# Patient Record
Sex: Female | Born: 1940 | Race: White | Hispanic: No | Marital: Married | Smoking: Never smoker
Health system: Southern US, Community
[De-identification: ages and names within clinical notes are randomized; demographics above are authoritative.]

## PROBLEM LIST (undated history)

## (undated) DIAGNOSIS — C801 Malignant (primary) neoplasm, unspecified: Secondary | ICD-10-CM

## (undated) DIAGNOSIS — K219 Gastro-esophageal reflux disease without esophagitis: Secondary | ICD-10-CM

## (undated) DIAGNOSIS — R002 Palpitations: Secondary | ICD-10-CM

## (undated) DIAGNOSIS — E039 Hypothyroidism, unspecified: Secondary | ICD-10-CM

## (undated) HISTORY — PX: COLONOSCOPY: SHX174

## (undated) HISTORY — PX: MOHS SURGERY: SUR867

---

## 2022-01-23 ENCOUNTER — Telehealth: Payer: Self-pay | Admitting: Family Medicine

## 2022-01-23 ENCOUNTER — Ambulatory Visit: Payer: Medicare HMO | Admitting: Podiatry

## 2022-01-23 ENCOUNTER — Emergency Department
Admission: EM | Admit: 2022-01-23 | Discharge: 2022-01-23 | Disposition: A | Discharge status: Home / Self Care | Payer: Medicare HMO | Source: Home / Self Care

## 2022-01-23 ENCOUNTER — Emergency Department (INDEPENDENT_AMBULATORY_CARE_PROVIDER_SITE_OTHER): Payer: Medicare HMO

## 2022-01-23 DIAGNOSIS — S82891A Other fracture of right lower leg, initial encounter for closed fracture: Secondary | ICD-10-CM

## 2022-01-23 DIAGNOSIS — S93431A Sprain of tibiofibular ligament of right ankle, initial encounter: Secondary | ICD-10-CM | POA: Diagnosis not present

## 2022-01-23 DIAGNOSIS — M25571 Pain in right ankle and joints of right foot: Secondary | ICD-10-CM

## 2022-01-23 DIAGNOSIS — S82401B Unspecified fracture of shaft of right fibula, initial encounter for open fracture type I or II: Secondary | ICD-10-CM

## 2022-01-23 DIAGNOSIS — S82111B Displaced fracture of right tibial spine, initial encounter for open fracture type I or II: Secondary | ICD-10-CM | POA: Diagnosis not present

## 2022-01-23 MED ORDER — HYDROCODONE-ACETAMINOPHEN 5-325 MG PO TABS
1.0000 | ORAL_TABLET | Freq: Every day | ORAL | 0 refills | Status: DC | PRN
Start: 2022-01-23 — End: 2023-07-28

## 2022-01-23 MED ORDER — HYDROCODONE-ACETAMINOPHEN 5-325 MG PO TABS: 1.0000 | ORAL_TABLET | Freq: Every day | ORAL | 0 refills | Status: DC | PRN

## 2022-01-23 MED ORDER — ACETAMINOPHEN 325 MG PO TABS
325.0000 mg | ORAL_TABLET | Freq: Once | ORAL | Status: AC
  Administered 2022-01-23: 325 mg via ORAL

## 2022-01-23 NOTE — ED Provider Notes (Signed)
[name]    CSN: [id number] Arrival date & time: 01/23/22  1052      History   Chief Complaint Chief Complaint  Patient presents with   Ankle Pain    RT    HPI Gina Cline is a 81 y.o. female.   HPI Very pleasant 81 year old female presents with right ankle pain since earlier this morning when she slipped in her garden.  Patient reports right ankle pain as 5 of 10 currently and has used ice and aspirin as needed.  Patient is accompanied by her husband and daughter this morning.  PMH significant for GERD and osteoporosis.  History reviewed. No pertinent past medical history.  There are no problems to display for this patient.   History reviewed. No pertinent surgical history.  OB History   No obstetric history on file.      Home Medications    Prior to Admission medications   Medication Sig Start Date End Date Taking? Authorizing Provider  HYDROcodone-acetaminophen (NORCO/VICODIN) 5-325 MG tablet Take 1 tablet by mouth daily as needed for moderate pain. 01/23/22  Yes Eliezer Lofts, FNP  omeprazole (PRILOSEC) 40 MG capsule Take by mouth.    [provider]    Family History History reviewed. No pertinent family history.  Social History Social History   Tobacco Use   Smoking status: Never   Smokeless tobacco: Never  Substance Use Topics   Alcohol use: Not Currently     Allergies   Penicillins and Erythromycin   Review of Systems Review of Systems  Musculoskeletal:        Right ankle pain x1 day  All other systems reviewed and are negative.    Physical Exam Triage Vital Signs ED Triage Vitals  Enc Vitals Group     BP 01/23/22 1106 (!) 166/84     Pulse Rate 01/23/22 1106 72     Resp 01/23/22 1106 17     Temp 01/23/22 1106 98.9 F (37.2 C)     Temp Source 01/23/22 1106 Oral     SpO2 01/23/22 1106 96 %     Weight --      Height --      Head Circumference --      Peak Flow --      Pain Score 01/23/22 1107 5      Pain Loc --      Pain Edu? --      Excl. in [place]? --    No data found.  Updated Vital Signs BP (!) 166/84 (BP Location: Left Arm)   Pulse 72   Temp 98.9 F (37.2 C) (Oral)   Resp 17   SpO2 96%      Physical Exam Vitals and nursing note reviewed.  Constitutional:      General: She is not in acute distress.    Appearance: Normal appearance. She is normal weight. She is not ill-appearing.  HENT:     Head: Normocephalic and atraumatic.     Mouth/Throat:     Mouth: Mucous membranes are moist.     Pharynx: Oropharynx is clear.  Eyes:     Extraocular Movements: Extraocular movements intact.     Conjunctiva/sclera: Conjunctivae normal.     Pupils: Pupils are equal, round, and reactive to light.  Cardiovascular:     Rate and Rhythm: Normal rate and regular rhythm.     Pulses: Normal pulses.     Heart sounds: Normal heart sounds.  Pulmonary:     Effort: Pulmonary  effort is normal.     Breath sounds: Normal breath sounds. No wheezing, rhonchi or rales.  Musculoskeletal:     Cervical back: Normal range of motion and neck supple.     Comments: Right ankle: TTP over lateral/medial malleolus with moderate soft tissue swelling noted, exam limited due to pain this morning  Skin:    General: Skin is warm and dry.  Neurological:     General: No focal deficit present.     Mental Status: She is alert and oriented to person, place, and time. Mental status is at baseline.     MDM: 1. UC Treatments / Results  Labs (all labs ordered are listed, but only abnormal results are displayed) Labs Reviewed - No data to display  EKG   Radiology DG Ankle Complete Right  Result Date: 01/23/2022 CLINICAL DATA:  Right ankle pain after slipped and garden this morning. Injury. Lateral pain and swelling. EXAM: RIGHT ANKLE - COMPLETE 3+ VIEW COMPARISON:  None Available. FINDINGS: Moderate lateral and mild medial malleolar soft tissue swelling. There is an oblique fracture of the distal fibular  metadiaphysis with up to 3 mm lateral cortical displacement/step-off of the distal component with respect to the proximal component. There is up to 3 mm posterior displacement of the distal fracture component with respect to the proximal fracture component on lateral view. Additional oblique fracture of the superior medial malleolus without displacement. Vertically oriented fracture of the posterior malleolus with approximately 2 mm superior displacement of the posterior component with respect to the anterior component. The ankle mortise remains symmetric and intact. Minimal plantar and posterior calcaneal heel spurs.  No dislocation. IMPRESSION: Mildly displaced distal fibular acute oblique fracture, mildly displaced acute posterior malleolar fracture, and nondisplaced superior medial malleolar acute fracture. Electronically Signed   By: Yvonne Kendall M.D.   On: 01/23/2022 11:29    Procedures Procedures (including critical care time)  Medications Ordered in UC Medications - No data to display  Initial Impression / Assessment and Plan / UC Course  I have reviewed the triage vital signs and the nursing notes.  Pertinent labs & imaging results that were available during my care of the patient were reviewed by me and considered in my medical decision making (see chart for details).    MDM: 1.  Type I/II open fracture of shaft of right fibula; 2.  Type I/II displaced fracture of right tibia, initial encounter, right ankle x-ray revealed above, Rx'd Vicodin, patient placed in cam walker boot and crutches prior to discharge.  Referred to [facility] for later today for further evaluation. Advised/informed patient of right ankle fractures and need for immediate orthopedic follow-up.  Advised patient to wear cam walker boot/ambulate with crutches or wheelchair 24/7 until being evaluated by orthopedic provider.  Encouraged patient to avoid any weightbearing on right extremity.  Advised patient may RICE  affected area of right ankle for 25 minutes 3 times daily for the next 3 days.  Advised may use Vicodin daily or as needed for pain.  Encouraged patient to increase daily water intake while taking this medication.  RN staff has called and made appointment for patient at [facility] for later on this afternoon for orthopedics/fracture management. Patient discharged home, hemodynamically stable. Final Clinical Impressions(s) / UC Diagnoses   Final diagnoses:  Acute right ankle pain  Type I or II open fracture of shaft of right fibula, unspecified fracture morphology, initial encounter  Type I or II open displaced  fracture of spine of right tibia, initial encounter     Discharge Instructions      Advised/informed patient of right ankle fractures and need for immediate orthopedic follow-up.  Advised patient to wear cam walker boot/ambulate with crutches or wheelchair 24/7 until being evaluated by orthopedic provider.  Encouraged patient to avoid any weightbearing on right extremity.  Advised patient may RICE affected area of right ankle for 25 minutes 3 times daily for the next 3 days.  Advised may use Vicodin daily or as needed for pain.  Encouraged patient to increase daily water intake while taking this medication.  RN staff has called and made appointment for patient at [facility] for later on this afternoon for orthopedics/fracture management.     ED Prescriptions     Medication Sig Dispense Auth. Provider   HYDROcodone-acetaminophen (NORCO/VICODIN) 5-325 MG tablet Take 1 tablet by mouth daily as needed for moderate pain. 21 tablet Eliezer Lofts, FNP      I have reviewed the PDMP during this encounter.   Eliezer Lofts, [place] 01/23/22 1209

## 2022-01-23 NOTE — Discharge Instructions (Addendum)
Advised/informed patient of right ankle fractures and need for immediate orthopedic follow-up.  Advised patient to wear cam walker boot/ambulate with crutches or wheelchair 24/7 until being evaluated by orthopedic provider.  Encouraged patient to avoid any weightbearing on right extremity.  Advised patient may RICE affected area of right ankle for 25 minutes 3 times daily for the next 3 days.  Advised may use Vicodin daily or as needed for pain.  Encouraged patient to increase daily water intake while taking this medication.  RN staff has called and made appointment for patient at [facility] for later on this afternoon for orthopedics/fracture management.

## 2022-01-23 NOTE — Telephone Encounter (Signed)
Patient was unable to have prescription filled at originally requested pharmacy.  Reports medication was not available at this pharmacy and requested new pharmacy.  Vicodin has been sent to [facility] in [facility] [place], [place] as requested.

## 2022-01-23 NOTE — ED Triage Notes (Addendum)
Pt c/o RT ankle pain since this morning when she slipped in her garden. Ice and aspirin prn. Pain 5/10

## 2022-01-24 ENCOUNTER — Telehealth: Payer: Self-pay | Admitting: Urology

## 2022-01-28 ENCOUNTER — Telehealth: Payer: Self-pay | Admitting: Family Medicine

## 2022-01-28 ENCOUNTER — Telehealth: Payer: Self-pay

## 2022-01-28 MED ORDER — HYDROCODONE-ACETAMINOPHEN 5-325 MG PO TABS: 2.0000 | ORAL_TABLET | Freq: Two times a day (BID) | ORAL | 0 refills | Status: DC | PRN

## 2022-01-28 NOTE — Telephone Encounter (Signed)
Patient/patient's husband calling to refill pain medication-Vicodin.  Patient evaluated by me on 621/23 for displaced fracture of distal right tibia/fibula and is scheduled for right ankle ORIF on Friday, 02/01/2022.  Advised husband hardcopy prescription will be available at front desk for him to pick up.

## 2022-01-30 ENCOUNTER — Encounter
Admission: RE | Admit: 2022-01-30 | Discharge: 2022-01-30 | Disposition: A | Discharge status: Home / Self Care | Payer: Medicare HMO | Source: Ambulatory Visit | Attending: Podiatry | Admitting: Podiatry

## 2022-01-30 HISTORY — DX: Malignant (primary) neoplasm, unspecified: C80.1

## 2022-01-30 HISTORY — DX: Hypothyroidism, unspecified: E03.9

## 2022-01-30 HISTORY — DX: Gastro-esophageal reflux disease without esophagitis: K21.9

## 2022-01-30 HISTORY — DX: Palpitations: R00.2

## 2022-01-30 NOTE — Progress Notes (Signed)
Perioperative Services Pre-Admission/Anesthesia Testing   Date: 01/30/22 Name: Gina Cline MRN:   [id number]  Re: Consideration of preoperative prophylactic antibiotic change   Request sent to: Felipa Furnace, DPM (routed and/or faxed via [facility])  Planned Surgical Procedure(s):    Case: [id number] Date/Time: 02/01/22 1230   Procedures:      OPEN REDUCTION INTERNAL FIXATION (ORIF) RIGHT ANKLE FRACTURE (Right: Ankle) - CHOICE WITH BLOCK     RIGHT SYNDESMOSIS REPAIR (Right)   Anesthesia type: Choice   Pre-op diagnosis: RIGHT ANKLE FRACTURE   Location: [facility] OR ROOM 09 / [facility] AN[facility]   Surgeons: Felipa Furnace, DPM   Clinical Notes:  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 No severe rash involving mucous membranes or skin necrosis. No hospital admissions related to side effects of PCN/cephalosporin use.  No documented reaction to PCN or cephalosporin in the last 10 years.  Request:  As an evidence based approach to reducing the rate of incidence for post-operative SSI and the development of MDROs, could an agent that allows for narrower antimicrobial coverage for preoperative prophylaxis in this patient's upcoming surgical course be considered?   Currently ordered preoperative prophylactic ABX: vancomycin.   Specifically requesting change to cephalosporin (CEFAZOLIN).  Drug of choice for many procedures; it is the most widely studied antimicrobial agent with proven efficacy for antimicrobial prophylaxis.   Desirable duration of action, spectrum of activity against organisms commonly encountered in surgery, and it has an excellent safety profile and low cost.   Active against streptococci, methicillin-susceptible staphylococci, and many gram-negative organisms.  Please  communicate decision with me and I will change the orders in Epic as per your direction.   Things to consider: Many patients report that they were "allergic" to PCN earlier in life, however this does not translate into a true lifelong allergy. Patients can lose sensitivity to specific IgE antibodies over time if PCN is avoided (Kleris & Lugar, 2019).  Up to 10% of the adult population and 15% of hospitalized patients report an allergy to PCN, however clinical studies suggest that 90% of those reporting an allergy can tolerate PCN antibiotics (Kleris & Lugar, 2019).  Cross-sensitivity between PCN and cephalosporins has been documented as being as high as 10%, however this estimation included data believed to have been collected in a setting where there was contamination. Newer data suggests that the prevalence of cross-sensitivity between PCN and cephalosporins is actually estimated to be closer to 1% (Hermanides et al., 2018).   Patients labeled as PCN allergic, whether they are truly allergic or not, have been found to have inferior outcomes in terms of rates of serious infection, and these patients tend to have longer hospital stays ([place], 2019).  Treatment related secondary infections, such as Clostridioides difficile, have been linked to the improper use of broad spectrum antibiotics in patients improperly labeled as PCN allergic (Kleris & Lugar, 2019).  Anaphylaxis from cephalosporins is rare and the evidence suggests that there is no increased risk of an anaphylactic type reaction when cephalosporins are used in a PCN allergic patient (Pichichero, 2006).  Citations: Hermanides J, Lemkes BA, Prins Pearla Dubonnet MW, Terreehorst I. Presumed ?-Lactam Allergy and Cross-reactivity in the Operating Theater: A Practical Approach. Anesthesiology. 2018 Aug;129(2):335-342. doi: 10.1097/ALN.0000000000002252. PMID: 29937169.  Kleris, Dunbar., & Lugar, P. L. (2019). Things We Do For  No Reason: Failing to  Question a Penicillin Allergy History. Journal of hospital medicine, 14(10), [phone]. Advance online publication. https://www.wallace-m[place]leton.info/  Pichichero, M. E. (2006). Cephalosporins can be prescribed safely for penicillin-allergic patients. Journal of family medicine, 55(2), 106-112. Accessed: https://cdn.mdedge.com/files/s85f-public/Document/September-2017/5502JFP_AppliedEvidence1.pdf   BHonor Loh MSN, APRN, FNP-C, CEN [facility] Peri-operative Services Nurse Practitioner FAX: [phone]/28/23 5:20 PM

## 2022-01-30 NOTE — Patient Instructions (Signed)
Your procedure is scheduled on:02-01-22 Friday Report to the Registration Desk on the 1st floor of the [facility].Then proceed to the 2nd floor Surgery Desk To find out your arrival time, please call [phone] between 1PM - 3PM on:01-31-22 Thursday If your arrival time is 6:00 am, do not arrive prior to that time as the [facility] entrance doors do not open until 6:00 am.  REMEMBER: Instructions that are not followed completely may result in serious medical risk, up to and including death; or upon the discretion of your surgeon and anesthesiologist your surgery may need to be rescheduled.  Do not eat food after midnight the night before surgery.  No gum chewing, lozengers or hard candies.  You may however, drink CLEAR liquids up to 2 hours before you are scheduled to arrive for your surgery. Do not drink anything within 2 hours of your scheduled arrival time.  Clear liquids include: - water  - apple juice without pulp - gatorade (not RED colors) - black coffee or tea (Do NOT add milk or creamers to the coffee or tea) Do NOT drink anything that is not on this list.  TAKE THESE MEDICATIONS THE MORNING OF SURGERY WITH A SIP OF WATER: -omeprazole (PRILOSEC) -take one the night before and one on the morning of surgery - helps to prevent nausea after surgery.)  One week prior to surgery: Stop Anti-inflammatories (NSAIDS) such as Advil, Aleve, Ibuprofen, Motrin, Naproxen, Naprosyn and Aspirin based products such as Excedrin, Goodys Powder, BC Powder.You may however, continue to take Tylenol/Hydrocodone if needed for pain up until the day of surgery.  Stop ANY OVER THE COUNTER supplements/vitamins NOW (01-30-22) until after surgery.  No Alcohol for 24 hours before or after surgery.  No Smoking including e-cigarettes for 24 hours prior to surgery.  No chewable tobacco products for at least 6 hours prior to surgery.  No nicotine patches on the day of surgery.  Do not use any  "recreational" drugs for at least a week prior to your surgery.  Please be advised that the combination of cocaine and anesthesia may have negative outcomes, up to and including death. If you test positive for cocaine, your surgery will be cancelled.  On the morning of surgery brush your teeth with toothpaste and water, you may rinse your mouth with mouthwash if you wish. Do not swallow any toothpaste or mouthwash.  Do not wear jewelry, make-up, hairpins, clips or nail polish.  Do not wear lotions, powders, or perfumes.   Do not shave body from the neck down 48 hours prior to surgery just in case you cut yourself which could leave a site for infection.  Also, freshly shaved skin may become irritated if using the CHG soap.  Contact lenses, hearing aids and dentures may not be worn into surgery.  Do not bring valuables to the hospital. [facility] is not responsible for any missing/lost belongings or valuables.   Notify your doctor if there is any change in your medical condition (cold, fever, infection).  Wear comfortable clothing (specific to your surgery type) to the hospital.  After surgery, you can help prevent lung complications by doing breathing exercises.  Take deep breaths and cough every 1-2 hours. Your doctor may order a device called an Incentive Spirometer to help you take deep breaths. When coughing or sneezing, hold a pillow firmly against your incision with both hands. This is called "splinting." Doing this helps protect your incision. It also decreases belly discomfort.  If you are  being admitted to the hospital overnight, leave your suitcase in the car. After surgery it may be brought to your room.  If you are being discharged the day of surgery, you will not be allowed to drive home. You will need a responsible adult (18 years or older) to drive you home and stay with you that night.   If you are taking public transportation, you will need to have a responsible adult  (18 years or older) with you. Please confirm with your physician that it is acceptable to use public transportation.   Please call the [facility]. at [phone] if you have any questions about these instructions.  Surgery Visitation Policy:  Patients undergoing a surgery or procedure may have two family members or support persons with them as long as the person is not COVID-19 positive or experiencing its symptoms.

## 2022-01-30 NOTE — H&P (View-Only) (Signed)
Perioperative Services Pre-Admission/Anesthesia Testing   Date: 01/30/22 Name: Gina Cline MRN:   [id number]  Re: Consideration of preoperative prophylactic antibiotic change   Request sent to: Patel, Kevin P, DPM (routed and/or faxed via [facility])  Planned Surgical Procedure(s):    Case: [id number] Date/Time: 02/01/22 1230   Procedures:      OPEN REDUCTION INTERNAL FIXATION (ORIF) RIGHT ANKLE FRACTURE (Right: Ankle) - CHOICE WITH BLOCK     RIGHT SYNDESMOSIS REPAIR (Right)   Anesthesia type: Choice   Pre-op diagnosis: RIGHT ANKLE FRACTURE   Location: [facility] OR ROOM 09 / [facility] FOR ANEST[facility]   Surgeons: Patel, Kevin P, DPM   Clinical Notes:  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 No severe rash involving mucous membranes or skin necrosis. No hospital admissions related to side effects of PCN/cephalosporin use.  No documented reaction to PCN or cephalosporin in the last 10 years.  Request:  As an evidence based approach to reducing the rate of incidence for post-operative SSI and the development of MDROs, could an agent that allows for narrower antimicrobial coverage for preoperative prophylaxis in this patient's upcoming surgical course be considered?   Currently ordered preoperative prophylactic ABX: vancomycin.   Specifically requesting change to cephalosporin (CEFAZOLIN).  Drug of choice for many procedures; it is the most widely studied antimicrobial agent with proven efficacy for antimicrobial prophylaxis.   Desirable duration of action, spectrum of activity against organisms commonly encountered in surgery, and it has an excellent safety profile and low cost.   Active against streptococci, methicillin-susceptible staphylococci, and many gram-negative organisms.  Please  communicate decision with me and I will change the orders in Epic as per your direction.   Things to consider: Many patients report that they were "allergic" to PCN earlier in life, however this does not translate into a true lifelong allergy. Patients can lose sensitivity to specific IgE antibodies over time if PCN is avoided (Kleris & Lugar, 2019).  Up to 10% of the adult population and 15% of hospitalized patients report an allergy to PCN, however clinical studies suggest that 90% of those reporting an allergy can tolerate PCN antibiotics (Kleris & Lugar, 2019).  Cross-sensitivity between PCN and cephalosporins has been documented as being as high as 10%, however this estimation included data believed to have been collected in a setting where there was contamination. Newer data suggests that the prevalence of cross-sensitivity between PCN and cephalosporins is actually estimated to be closer to 1% (Hermanides et al., 2018).   Patients labeled as PCN allergic, whether they are truly allergic or not, have been found to have inferior outcomes in terms of rates of serious infection, and these patients tend to have longer hospital stays (Kleris & Lugar, 2019).  Treatment related secondary infections, such as Clostridioides difficile, have been linked to the improper use of broad spectrum antibiotics in patients improperly labeled as PCN allergic (Kleris & Lugar, 2019).  Anaphylaxis from cephalosporins is rare and the evidence suggests that there is no increased risk of an anaphylactic type reaction when cephalosporins are used in a PCN allergic patient (Pichichero, 2006).  Citations: Hermanides J, Lemkes BA, Prins JM, Hollmann MW, Terreehorst I. Presumed ?-Lactam Allergy and Cross-reactivity in the Operating Theater: A Practical Approach. Anesthesiology. 2018 Aug;129(2):335-342. doi: 10.1097/ALN.0000000000002252. PMID: 29762180.  Kleris, R. S., & Lugar, P. L. (2019). Things We Do For   No Reason: Failing to  Question a Penicillin Allergy History. Journal of hospital medicine, 14(10), 704-706. Advance online publication. https://doi.org/10.12788/jhm.3170  Pichichero, M. E. (2006). Cephalosporins can be prescribed safely for penicillin-allergic patients. Journal of family medicine, 55(2), 106-112. Accessed: https://cdn.mdedge.com/files/s3fs-public/Document/September-2017/5502JFP_AppliedEvidence1.pdf   Malosi Hemstreet, MSN, APRN, FNP-C, CEN [facility]  Peri-operative Services Nurse Practitioner FAX: [phone] 01/30/22 5:20 PM  

## 2022-02-01 ENCOUNTER — Other Ambulatory Visit: Payer: Self-pay

## 2022-02-01 ENCOUNTER — Encounter: Payer: Self-pay | Admitting: Podiatry

## 2022-02-01 ENCOUNTER — Ambulatory Visit: Payer: Medicare HMO | Admitting: Registered Nurse

## 2022-02-01 ENCOUNTER — Ambulatory Visit: Payer: Medicare HMO

## 2022-02-01 ENCOUNTER — Encounter
Admission: RE | Disposition: A | Discharge status: Home / Self Care | Payer: Self-pay | Source: Home / Self Care | Attending: Podiatry

## 2022-02-01 ENCOUNTER — Ambulatory Visit
Admission: RE | Admit: 2022-02-01 | Discharge: 2022-02-01 | Disposition: A | Discharge status: Home / Self Care | Payer: Medicare HMO | Attending: Podiatry | Admitting: Podiatry

## 2022-02-01 ENCOUNTER — Other Ambulatory Visit: Payer: Self-pay | Admitting: Podiatry

## 2022-02-01 DIAGNOSIS — X58XXXA Exposure to other specified factors, initial encounter: Secondary | ICD-10-CM | POA: Diagnosis not present

## 2022-02-01 DIAGNOSIS — E039 Hypothyroidism, unspecified: Secondary | ICD-10-CM | POA: Diagnosis not present

## 2022-02-01 DIAGNOSIS — K219 Gastro-esophageal reflux disease without esophagitis: Secondary | ICD-10-CM | POA: Diagnosis not present

## 2022-02-01 DIAGNOSIS — S82851A Displaced trimalleolar fracture of right lower leg, initial encounter for closed fracture: Secondary | ICD-10-CM | POA: Insufficient documentation

## 2022-02-01 DIAGNOSIS — S82891A Other fracture of right lower leg, initial encounter for closed fracture: Secondary | ICD-10-CM

## 2022-02-01 HISTORY — PX: ORIF ANKLE FRACTURE: SHX5408

## 2022-02-01 SURGERY — OPEN REDUCTION INTERNAL FIXATION (ORIF) ANKLE FRACTURE
Anesthesia: General | Site: Ankle | Laterality: Right

## 2022-02-01 MED ORDER — DEXMEDETOMIDINE (PRECEDEX) IN NS 20 MCG/5ML (4 MCG/ML) IV SYRINGE
PREFILLED_SYRINGE | INTRAVENOUS | Status: DC | PRN
  Administered 2022-02-01: 8 ug via INTRAVENOUS

## 2022-02-01 MED ORDER — OXYCODONE HCL 5 MG PO TABS
ORAL_TABLET | ORAL | Status: AC
  Filled 2022-02-01: qty 1

## 2022-02-01 MED ORDER — FENTANYL CITRATE (PF) 100 MCG/2ML IJ SOLN
INTRAMUSCULAR | Status: AC
  Filled 2022-02-01: qty 2

## 2022-02-01 MED ORDER — CHLORHEXIDINE GLUCONATE 0.12 % MT SOLN
OROMUCOSAL | Status: AC
  Filled 2022-02-01: qty 15

## 2022-02-01 MED ORDER — SODIUM CHLORIDE FLUSH 0.9 % IV SOLN
INTRAVENOUS | Status: AC
  Filled 2022-02-01: qty 20

## 2022-02-01 MED ORDER — HYDROMORPHONE HCL 1 MG/ML IJ SOLN
INTRAMUSCULAR | Status: AC
  Administered 2022-02-01: 0.5 mg via INTRAVENOUS
  Filled 2022-02-01: qty 1

## 2022-02-01 MED ORDER — FENTANYL CITRATE (PF) 100 MCG/2ML IJ SOLN
25.0000 ug | INTRAMUSCULAR | Status: AC | PRN
  Administered 2022-02-01 (×6): 25 ug via INTRAVENOUS

## 2022-02-01 MED ORDER — PROPOFOL 10 MG/ML IV BOLUS
INTRAVENOUS | Status: DC | PRN
  Administered 2022-02-01: 90 mg via INTRAVENOUS

## 2022-02-01 MED ORDER — ACETAMINOPHEN 10 MG/ML IV SOLN
INTRAVENOUS | Status: DC | PRN
  Administered 2022-02-01: 1000 mg via INTRAVENOUS

## 2022-02-01 MED ORDER — OXYCODONE HCL 5 MG PO TABS
5.0000 mg | ORAL_TABLET | Freq: Once | ORAL | Status: AC
  Administered 2022-02-01: 5 mg via ORAL

## 2022-02-01 MED ORDER — 0.9 % SODIUM CHLORIDE (POUR BTL) OPTIME
TOPICAL | Status: DC | PRN
  Administered 2022-02-01: 500 mL

## 2022-02-01 MED ORDER — ONDANSETRON HCL 4 MG/2ML IJ SOLN
INTRAMUSCULAR | Status: AC
  Filled 2022-02-01: qty 2

## 2022-02-01 MED ORDER — KETOROLAC TROMETHAMINE 15 MG/ML IJ SOLN
INTRAMUSCULAR | Status: AC
  Filled 2022-02-01: qty 1

## 2022-02-01 MED ORDER — ORAL CARE MOUTH RINSE: 15.0000 mL | Freq: Once | OROMUCOSAL | Status: AC

## 2022-02-01 MED ORDER — CHLORHEXIDINE GLUCONATE 0.12 % MT SOLN
15.0000 mL | Freq: Once | OROMUCOSAL | Status: AC
  Administered 2022-02-01: 15 mL via OROMUCOSAL

## 2022-02-01 MED ORDER — VANCOMYCIN HCL IN DEXTROSE 1-5 GM/200ML-% IV SOLN
1000.0000 mg | Freq: Once | INTRAVENOUS | Status: AC
  Administered 2022-02-01: 1000 mg via INTRAVENOUS

## 2022-02-01 MED ORDER — LIDOCAINE HCL (CARDIAC) PF 100 MG/5ML IV SOSY
PREFILLED_SYRINGE | INTRAVENOUS | Status: DC | PRN
  Administered 2022-02-01: 80 mg via INTRAVENOUS

## 2022-02-01 MED ORDER — DEXMEDETOMIDINE HCL IN NACL 80 MCG/20ML IV SOLN
INTRAVENOUS | Status: AC
  Filled 2022-02-01: qty 20

## 2022-02-01 MED ORDER — ACETAMINOPHEN 10 MG/ML IV SOLN
INTRAVENOUS | Status: AC
  Filled 2022-02-01: qty 100

## 2022-02-01 MED ORDER — IBUPROFEN 800 MG PO TABS: 800.0000 mg | ORAL_TABLET | Freq: Four times a day (QID) | ORAL | 1 refills | Status: DC | PRN

## 2022-02-01 MED ORDER — ONDANSETRON HCL 4 MG/2ML IJ SOLN
INTRAMUSCULAR | Status: DC | PRN
  Administered 2022-02-01: 4 mg via INTRAVENOUS

## 2022-02-01 MED ORDER — HYDROCODONE-ACETAMINOPHEN 5-325 MG PO TABS: 1.0000 | ORAL_TABLET | Freq: Four times a day (QID) | ORAL | 0 refills | Status: DC | PRN

## 2022-02-01 MED ORDER — HYDROMORPHONE HCL 1 MG/ML IJ SOLN
0.2500 mg | INTRAMUSCULAR | Status: DC | PRN
  Administered 2022-02-01: 0.5 mg via INTRAVENOUS

## 2022-02-01 MED ORDER — LACTATED RINGERS IV SOLN: INTRAVENOUS | Status: DC

## 2022-02-01 MED ORDER — FENTANYL CITRATE (PF) 100 MCG/2ML IJ SOLN
INTRAMUSCULAR | Status: AC
  Administered 2022-02-01: 25 ug via INTRAVENOUS
  Filled 2022-02-01: qty 2

## 2022-02-01 MED ORDER — STERILE WATER FOR IRRIGATION IR SOLN
Status: DC | PRN
  Administered 2022-02-01: 500 mL

## 2022-02-01 MED ORDER — ONDANSETRON HCL 4 MG/2ML IJ SOLN: 4.0000 mg | Freq: Once | INTRAMUSCULAR | Status: DC | PRN

## 2022-02-01 MED ORDER — BUPIVACAINE HCL (PF) 0.5 % IJ SOLN
INTRAMUSCULAR | Status: AC
Start: 2022-02-01 — End: ?
  Filled 2022-02-01: qty 30

## 2022-02-01 MED ORDER — KETOROLAC TROMETHAMINE 15 MG/ML IJ SOLN
15.0000 mg | Freq: Once | INTRAMUSCULAR | Status: AC
  Administered 2022-02-01: 15 mg via INTRAVENOUS

## 2022-02-01 MED ORDER — ROPIVACAINE HCL 5 MG/ML IJ SOLN
INTRAMUSCULAR | Status: AC
  Filled 2022-02-01: qty 30

## 2022-02-01 MED ORDER — LIDOCAINE HCL (PF) 2 % IJ SOLN
INTRAMUSCULAR | Status: AC
  Filled 2022-02-01: qty 5

## 2022-02-01 MED ORDER — LIDOCAINE HCL (PF) 1 % IJ SOLN
INTRAMUSCULAR | Status: DC | PRN
  Administered 2022-02-01: 2 mL

## 2022-02-01 MED ORDER — VANCOMYCIN HCL IN DEXTROSE 1-5 GM/200ML-% IV SOLN
INTRAVENOUS | Status: AC
  Administered 2022-02-01: 1000 mg via INTRAVENOUS
  Filled 2022-02-01: qty 200

## 2022-02-01 MED ORDER — PROPOFOL 500 MG/50ML IV EMUL
INTRAVENOUS | Status: DC | PRN
  Administered 2022-02-01: 125 ug/kg/min via INTRAVENOUS

## 2022-02-01 MED ORDER — DEXAMETHASONE SODIUM PHOSPHATE 10 MG/ML IJ SOLN
INTRAMUSCULAR | Status: DC | PRN
  Administered 2022-02-01: 5 mg via INTRAVENOUS

## 2022-02-01 MED ORDER — FENTANYL CITRATE (PF) 100 MCG/2ML IJ SOLN
INTRAMUSCULAR | Status: DC | PRN
  Administered 2022-02-01 (×3): 25 ug via INTRAVENOUS

## 2022-02-01 MED ORDER — DEXAMETHASONE SODIUM PHOSPHATE 10 MG/ML IJ SOLN
INTRAMUSCULAR | Status: AC
  Filled 2022-02-01: qty 1

## 2022-02-01 MED ORDER — LIDOCAINE HCL (PF) 2 % IJ SOLN
INTRAMUSCULAR | Status: AC
  Filled 2022-02-01: qty 10

## 2022-02-01 MED ORDER — ROPIVACAINE HCL 5 MG/ML IJ SOLN
INTRAMUSCULAR | Status: DC | PRN
  Administered 2022-02-01: 5 mL via EPIDURAL
  Administered 2022-02-01: 22 mL via EPIDURAL

## 2022-02-01 SURGICAL SUPPLY — 59 items
BIT DRILL 2 QR W/DEPTH MARKS (BIT) ×3
BIT DRILL 2.8 QR W/DEPTH MARKS (BIT) ×2 IMPLANT
BIT DRILL 27 CANN QC (BIT) ×2 IMPLANT
BIT DRILL 2MM QR W/DEPTH MARKS (BIT) ×1 IMPLANT
BNDG COHESIVE 4X5 TAN ST LF (GAUZE/BANDAGES/DRESSINGS) ×3 IMPLANT
BNDG ELASTIC 4X5.8 VLCR NS LF (GAUZE/BANDAGES/DRESSINGS) ×3 IMPLANT
BNDG ESMARK 4X12 TAN STRL LF (GAUZE/BANDAGES/DRESSINGS) ×3 IMPLANT
BNDG GAUZE DERMACEA FLUFF (GAUZE/BANDAGES/DRESSINGS) ×1
BNDG GAUZE DERMACEA FLUFF 4 (GAUZE/BANDAGES/DRESSINGS) ×2 IMPLANT
CHLORAPREP W/TINT 26 (MISCELLANEOUS) ×3 IMPLANT
CUFF TOURN SGL QUICK 18X4 (TOURNIQUET CUFF) IMPLANT
CUFF TOURN SGL QUICK 24 (TOURNIQUET CUFF)
CUFF TRNQT CYL 24X4X16.5-23 (TOURNIQUET CUFF) IMPLANT
DRAPE C-ARM 42X70 (DRAPES) ×3 IMPLANT
DRAPE C-ARMOR (DRAPES) ×3 IMPLANT
ELECT REM PT RETURN 9FT ADLT (ELECTROSURGICAL) ×3
ELECTRODE REM PT RTRN 9FT ADLT (ELECTROSURGICAL) ×2 IMPLANT
GAUZE SPONGE 4X4 12PLY STRL (GAUZE/BANDAGES/DRESSINGS) ×3 IMPLANT
GAUZE XEROFORM 1X8 LF (GAUZE/BANDAGES/DRESSINGS) ×3 IMPLANT
GLOVE BIO SURGEON STRL SZ7.5 (GLOVE) ×3 IMPLANT
GLOVE SURG SYN 7.0 (GLOVE) ×3 IMPLANT
GLOVE SURG SYN 7.0 PF PI (GLOVE) ×1 IMPLANT
GOWN STRL REUS W/ TWL LRG LVL3 (GOWN DISPOSABLE) ×2 IMPLANT
GOWN STRL REUS W/ TWL XL LVL3 (GOWN DISPOSABLE) ×2 IMPLANT
GOWN STRL REUS W/TWL LRG LVL3 (GOWN DISPOSABLE) ×1
GOWN STRL REUS W/TWL XL LVL3 (GOWN DISPOSABLE) ×1
GUIDEWIRE ORTHO 1.3X150 NT (WIRE) ×2 IMPLANT
KIT TURNOVER KIT A (KITS) ×3 IMPLANT
LABEL OR SOLS (LABEL) ×3 IMPLANT
MANIFOLD NEPTUNE II (INSTRUMENTS) ×3 IMPLANT
NEEDLE HYPO 22GX1.5 SAFETY (NEEDLE) ×3 IMPLANT
NS IRRIG 500ML POUR BTL (IV SOLUTION) ×3 IMPLANT
PACK EXTREMITY ARMC (MISCELLANEOUS) ×3 IMPLANT
PAD ABD DERMACEA PRESS 5X9 (GAUZE/BANDAGES/DRESSINGS) ×1 IMPLANT
PAD PREP 24X41 OB/GYN DISP (PERSONAL CARE ITEMS) ×3 IMPLANT
PLATE FIBULA 6H LATERAL (Plate) ×2 IMPLANT
SCREW CANN LNG THRD 4.0X58 (Screw) ×2 IMPLANT
SCREW HEX LOCK  2.7X8MM (Screw) ×1 IMPLANT
SCREW HEXALOBE VA 3.5X10 (Screw) ×2 IMPLANT
SCREW LOCK 12X2.7X HEXALOBE (Screw) ×2 IMPLANT
SCREW LOCKING 2.7X10 (Screw) ×2 IMPLANT
SCREW LOCKING 2.7X12MM (Screw) ×2 IMPLANT
SCREW LOCKING 3.5X10MM (Screw) ×4 IMPLANT
SCREW LOCKING 3.5X12 (Screw) ×1 IMPLANT
SCREW NLCKG 2.7X10 HEXA (Screw) ×1 IMPLANT
SCREW NLOCK 24X2.7 (Screw) ×1 IMPLANT
SCREW NON LOCK 3.5X10MM (Screw) ×2 IMPLANT
SCREW NON LOCKING 2.7X10 (Screw) ×3 IMPLANT
SCREW NONLOCK 2.7X24 (Screw) ×1 IMPLANT
SPONGE T-LAP 18X18 ~~LOC~~+RFID (SPONGE) ×3 IMPLANT
STAPLER SKIN PROX 35W (STAPLE) IMPLANT
STOCKINETTE M/LG 89821 (MISCELLANEOUS) ×3 IMPLANT
STRAP SAFETY 5IN WIDE (MISCELLANEOUS) ×3 IMPLANT
SUT MNCRL AB 3-0 PS2 27 (SUTURE) ×5 IMPLANT
SUT MNCRL AB 4-0 PS2 18 (SUTURE) ×3 IMPLANT
SUT MON AB 5-0 P3 18 (SUTURE) ×1 IMPLANT
SYR 10ML LL (SYRINGE) ×3 IMPLANT
WATER STERILE IRR 500ML POUR (IV SOLUTION) ×3 IMPLANT
WIRE PLATE TACK (WIRE) ×4 IMPLANT

## 2022-02-01 NOTE — Discharge Instructions (Addendum)
AMBULATORY SURGERY  DISCHARGE INSTRUCTIONS   The drugs that you were given will stay in your system until tomorrow so for the next 24 hours you should not:  Drive an automobile Make any legal decisions Drink any alcoholic beverage   You may resume regular meals tomorrow.  Today it is better to start with liquids and gradually work up to solid foods.  You may eat anything you prefer, but it is better to start with liquids, then soup and crackers, and gradually work up to solid foods.   Please notify your doctor immediately if you have any unusual bleeding, trouble breathing, redness and pain at the surgery site, drainage, fever, or pain not relieved by medication.    Please contact your physician with any problems or Same Day Surgery at [phone], Monday through Friday 6 am to 4 pm, or [place] at [facility] number at [phone].    After Surgery Instructions   1) If you are recuperating from surgery anywhere other than home, please be sure to leave [place] the number where you can be reached.  2) Go directly home and rest.  3) Keep the operated foot(feet) elevated six inches above the hip when sitting or lying down. This will help control swelling and pain.  4) Support the elevated foot and leg with pillows. DO NOT PLACE PILLOWS UNDER THE KNEE.  5) DO NOT REMOVE or get your bandages WET, unless you were given different instructions by your doctor to do so. This increases the risk of infection.  6) Wear your surgical shoe or surgical boot at all times when you are up on your feet.  7) A limited amount of pain and swelling may occur. The skin may take on a bruised appearance. DO NOT BE ALARMED, THIS IS NORMAL.  8) For slight pain and swelling, apply an ice pack directly over the bandages for 15 minutes only out of each hour of the day. Continue until seen in the office for your first post op visit. DON NOT     APPLY ANY FORM OF HEAT TO THE AREA.  9) Have prescriptions  filled immediately and take as directed.  10) Drink lots of liquids, water and juice to stay hydrated.  11) CALL IMMEDIATELY IF:  *Bleeding continues until the following day of surgery  *Pain increases and/or does not respond to medication  *Bandages or cast appears to tight  *If your bandage gets wet  *Trip, fall or stump your surgical foot  *If your temperature goes above 101  *If you have ANY questions at all  [place]. ADHERING TO THESE INSTRUCTIONS WILL OFFER YOU THE MOST COMPLETE RESULTS

## 2022-02-01 NOTE — Anesthesia Procedure Notes (Signed)
Procedure Name: LMA Insertion Date/Time: 02/01/2022 1:20 PM  Performed by: Doreen Salvage, CRNAPre-anesthesia Checklist: Patient identified, Patient being monitored, Timeout performed, Emergency Drugs available and Suction available Patient Re-evaluated:Patient Re-evaluated prior to induction Oxygen Delivery Method: Circle system utilized Preoxygenation: Pre-oxygenation with 100% oxygen Induction Type: IV induction Ventilation: Mask ventilation without difficulty LMA: LMA inserted LMA Size: 4.0 Tube type: Oral Number of attempts: 1 Placement Confirmation: positive ETCO2 and breath sounds checked- equal and bilateral Tube secured with: Tape Dental Injury: Teeth and Oropharynx as per pre-operative assessment

## 2022-02-01 NOTE — Op Note (Addendum)
Surgeon: Surgeon(s): Felipa Furnace, DPM  Assistants: None Pre-operative diagnosis: RIGHT ANKLE FRACTURE  Post-operative diagnosis: same Procedure: Procedure(s) (LRB): OPEN REDUCTION INTERNAL FIXATION (ORIF) RIGHT ANKLE FRACTURE (Right)  Pathology: * No specimens in log *  Pertinent Intra-op findings: Fibular fracture noted minimally displaced medial malleolus fracture noted Anesthesia: Choice  Hemostasis:  Total Tourniquet Time Documented: Thigh (Right) - 68 minutes Total: Thigh (Right) - 68 minutes  EBL: Minimal 50 cc Materials: Acumed ankle fracture plating system with 4 oh cancellous screw x1 Injectables: None Complications: None  Indications for surgery: A 81 y.o. female presents with right trimalleolar ankle fracture. Patient has failed all conservative therapy including but not limited to cast immobilization/cam boot immobilization. He wishes to have surgical correction of the foot/deformity. It was determined that patient would benefit from right ankle fracture open reduction internal fixation. Informed surgical risk consent was reviewed and read aloud to the patient.  I reviewed the films.  I have discussed my findings with the patient in great detail.  I have discussed all risks including but not limited to infection, stiffness, scarring, limp, disability, deformity, damage to blood vessels and nerves, numbness, poor healing, need for braces, arthritis, chronic pain, amputation, death.  All benefits and realistic expectations discussed in great detail.  I have made no promises as to the outcome.  I have provided realistic expectations.  I have offered the patient a 2nd opinion, which they have declined and assured me they preferred to proceed despite the risks   Procedure in detail: The patient was both verbally and visually identified by myself, the nursing staff, and anesthesia staff in the preoperative holding area. They were then transferred to the operating room and placed on  the operative table in supine position.  CHNIQUE: Prior to the procedure, the risks, benefits, and alternatives were discussed, and the patient agreed to proceed. Following anesthesia, He was placed in the appropriate position. Prepping and draping was performed. A timeout was done. The operative left lower extremity was outfitted with a pneumatic tourniquet at the level of the upper thigh. Next, the operative extremity was elevated and exsanguinated, the tourniquet was then elevated. At this point, attention was turned to the lateral aspect of the ankle. After marking the intended incision with a marking pen, a strait lateral incision was made directly over the distal fibula. The dissection was taken through the skin and subcutaneous layers directly to the level of the bone. A sub-periosteal dissection of the distal fibula fracture was performed and the fracture site identified. The fracture site was debrided of interposed soft tissue and hematoma. The fracture wasanatomic then reduced using a reduction clamp. Initial fixation was then performed with an anterior to posterior lag screw at the fracture site. The fracture was then further stabilized with a laterally based 1/3 semi-tubular locking plate. This was applied with bi-cortical screws to compress the plate to bone and then completed with locking screws. At this point, fluoroscopy showed the lateral malleolar fracture to be reduced and appropriately fixated. All hardware is in good position. Next, attention was turned to the medial side of the ankle. A curvilinear incision was made over the medial malleolus. This dissection was taken down through the skin and subcutaneous tissues to the level of the bone. The fracture site was identified and freed of interposed periosteum. The fracture was then reduced with a reduction clamp and temporarily stabilized with two 2.38m drill bits. Once confirming appropriate positioning of the drill bits by  fluoroscopy they were  replaced with1 x 4.0 partially threaded cancellous screw each. At this point fluoroscopy again confirmed appropriate fracture reduction and hardware placement No medial clear space gapping noted with dorsiflexion external stress test. Both wounds were then irrigated and closed in layers using 2-0 and 3-0 Monocryl for the subcutaneous layers and a stapler for the skin. Sterile dressings were then followed by application of cam boot nonweightbearing  At the conclusion of the procedure the patient was awoken from anesthesia and found to have tolerated the procedure well any complications. There were transferred to PACU with vital signs stable and vascular status intact.  Boneta Lucks, DPM

## 2022-02-01 NOTE — Anesthesia Procedure Notes (Signed)
Anesthesia Regional Block: Popliteal block (Popliteal and saphenous blocks)   Pre-Anesthetic Checklist: , timeout performed,  Correct Patient, Correct Site, Correct Laterality,  Correct Procedure, Correct Position, site marked,  Risks and benefits discussed,  Surgical consent,  Pre-op evaluation,  At surgeon's request and post-op pain management  Laterality: Lower and Right  Prep: chloraprep       Needles:  Injection technique: Single-shot  Needle Type: Echogenic Needle     Needle Length: 9cm  Needle Gauge: 21     Additional Needles:   Procedures:,,,, ultrasound used (permanent image in chart),,    Narrative:  Start time: 02/01/2022 4:10 AM End time: 02/01/2022 4:16 AM Injection made incrementally with aspirations every 5 mL.  Performed by: Personally  Anesthesiologist: Tera Mater, MD  Additional Notes: Popliteal/saphenous blocks  Patient consented for risk and benefits of nerve block including but not limited to nerve damage, failed block, bleeding and infection.  Patient voiced understanding.  Functioning IV was confirmed and monitors were applied.  Timeout done prior to procedure and prior to any sedation being given to the patient.  Patient confirmed procedure site prior to any sedation given to the patient.  A 56m 22ga Stimuplex needle was used. Sterile prep,hand hygiene and sterile gloves were used.  Minimal sedation used for procedure.  No paresthesia endorsed by patient during the procedure.  Negative aspiration and negative test dose prior to incremental administration of local anesthetic. The patient tolerated the procedure well with no immediate complications.

## 2022-02-01 NOTE — Interval H&P Note (Signed)
History and Physical Interval Note:  02/01/2022 1:13 PM  Laikynn Pollio  has presented today for surgery, with the diagnosis of RIGHT ANKLE FRACTURE.  The various methods of treatment have been discussed with the patient and family. After consideration of risks, benefits and other options for treatment, the patient has consented to  Procedure(s) with comments: OPEN REDUCTION INTERNAL FIXATION (ORIF) RIGHT ANKLE FRACTURE (Right) - CHOICE WITH BLOCK RIGHT SYNDESMOSIS REPAIR (Right) as a surgical intervention.  The patient's history has been reviewed, patient examined, no change in status, stable for surgery.  I have reviewed the patient's chart and labs.  Questions were answered to the patient's satisfaction.     Gina Cline

## 2022-02-01 NOTE — Transfer of Care (Signed)
Immediate Anesthesia Transfer of Care Note  Patient: Gina Cline  Procedure(s) Performed: Procedure(s) with comments: OPEN REDUCTION INTERNAL FIXATION (ORIF) RIGHT ANKLE FRACTURE (Right) - CHOICE WITH BLOCK  Patient Location: PACU  Anesthesia Type:General  Level of Consciousness: sedated  Airway & Oxygen Therapy: Patient Spontanous Breathing and Patient connected to face mask oxygen  Post-op Assessment: Report given to RN and Post -op Vital signs reviewed and stable  Post vital signs: Reviewed and stable  Last Vitals:  Vitals:   02/01/22 1451 02/01/22 1455  BP: (!) 151/73 (!) 151/73  Pulse: 61 63  Resp: 12 13  Temp:  (!) 36.1 C  SpO2: 037% 955%    Complications: No apparent anesthesia complications

## 2022-02-01 NOTE — OR Nursing (Signed)
Spoke with husband John via cell phone, updated on patient status at this time.

## 2022-02-01 NOTE — Anesthesia Preprocedure Evaluation (Addendum)
Anesthesia Evaluation  Patient identified by MRN, date of birth, ID band Patient awake    Reviewed: Allergy & Precautions, H&P , NPO status , Patient's Chart, lab work & pertinent test results, reviewed documented beta blocker date and time   Airway Mallampati: II  TM Distance: >3 FB Neck ROM: full    Dental  (+) Teeth Intact   Pulmonary neg pulmonary ROS,    Pulmonary exam normal        Cardiovascular Exercise Tolerance: Good negative cardio ROS Normal cardiovascular exam Rhythm:regular Rate:Normal     Neuro/Psych negative neurological ROS  negative psych ROS   GI/Hepatic negative GI ROS, Neg liver ROS, GERD  Medicated,  Endo/Other  negative endocrine ROSHypothyroidism   Renal/GU negative Renal ROS  negative genitourinary   Musculoskeletal   Abdominal   Peds  Hematology negative hematology ROS (+)   Anesthesia Other Findings Past Medical History: No date: Cancer ([place])     Comment:  basal cell skin cancer No date: GERD (gastroesophageal reflux disease) No date: Hypothyroidism     Comment:  h/o no meds No date: Palpitations     Comment:  very rarely when she lies down at night Past Surgical History: No date: COLONOSCOPY No date: MOHS SURGERY BMI    Body Mass Index: 21.26 kg/m     Reproductive/Obstetrics negative OB ROS                            Anesthesia Physical Anesthesia Plan  ASA: 3  Anesthesia Plan: General LMA   Post-op Pain Management:    Induction:   PONV Risk Score and Plan: 4 or greater  Airway Management Planned:   Additional Equipment:   Intra-op Plan:   Post-operative Plan:   Informed Consent: I have reviewed the patients History and Physical, chart, labs and discussed the procedure including the risks, benefits and alternatives for the proposed anesthesia with the patient or authorized representative who has indicated his/her understanding and  acceptance.     Dental Advisory Given  Plan Discussed with: CRNA  Anesthesia Plan Comments: (Refuses regional and desires GA.  Will proceed with GOT for above.   Pt complains of Bilateral swelling at angle of mandible.  New in onset occurring over the past hour or so, not noticed before by Son or patient. No complaints of sob or airway, oral symptoms. Not itching and no other symptoms. 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 LCTA and no wheezing. No erythema.   Will follow for change but see no reason not to proceed. ja)       Anesthesia Quick Evaluation

## 2022-02-02 ENCOUNTER — Other Ambulatory Visit: Payer: Self-pay | Admitting: Podiatry

## 2022-02-02 ENCOUNTER — Telehealth: Payer: Self-pay | Admitting: Podiatry

## 2022-02-02 MED ORDER — HYDROCODONE-ACETAMINOPHEN 5-325 MG PO TABS
1.0000 | ORAL_TABLET | Freq: Four times a day (QID) | ORAL | 0 refills | Status: DC | PRN
Start: 2022-02-02 — End: 2022-02-15

## 2022-02-02 NOTE — Telephone Encounter (Signed)
Patients husband called the answering service. The pharmacy did not have the pain medication. I sent it a different pharmacy for them.   The patients husband would like a copy of the x-rays from surgery sent to him so he and his daughter can see them. His e-mail is [email or web address]. I told him someone could send next week.

## 2022-02-06 NOTE — Anesthesia Postprocedure Evaluation (Signed)
Anesthesia Post Note  Patient: Gina Cline  Procedure(s) Performed: OPEN REDUCTION INTERNAL FIXATION (ORIF) RIGHT ANKLE FRACTURE (Right: Ankle)  Patient location during evaluation: PACU Anesthesia Type: General Level of consciousness: awake and alert Pain management: pain level controlled Vital Signs Assessment: post-procedure vital signs reviewed and stable Respiratory status: spontaneous breathing, nonlabored ventilation, respiratory function stable and patient connected to nasal cannula oxygen Cardiovascular status: blood pressure returned to baseline and stable Postop Assessment: no apparent nausea or vomiting Anesthetic complications: no   No notable events documented.   Last Vitals:  Vitals:   02/01/22 1745 02/01/22 1751  BP: (!) 150/68 (!) 150/68  Pulse: 71 68  Resp: 13 15  Temp:  (!) 36.2 C  SpO2: 90% 90%    Last Pain:  Vitals:   02/01/22 1751  TempSrc:   PainSc: 3                  Molli Barrows

## 2022-02-07 ENCOUNTER — Encounter: Payer: Self-pay | Admitting: Podiatry

## 2022-02-08 ENCOUNTER — Ambulatory Visit (INDEPENDENT_AMBULATORY_CARE_PROVIDER_SITE_OTHER): Payer: Medicare HMO

## 2022-02-08 ENCOUNTER — Ambulatory Visit (INDEPENDENT_AMBULATORY_CARE_PROVIDER_SITE_OTHER): Payer: Medicare HMO | Admitting: Podiatry

## 2022-02-08 DIAGNOSIS — S82891A Other fracture of right lower leg, initial encounter for closed fracture: Secondary | ICD-10-CM | POA: Diagnosis not present

## 2022-02-08 DIAGNOSIS — Z9889 Other specified postprocedural states: Secondary | ICD-10-CM

## 2022-02-13 NOTE — Progress Notes (Signed)
Subjective:  Patient ID: Gina Cline, female    DOB: [birth year],  MRN: [id number]  Chief Complaint  Patient presents with   Routine Post Op       (xray)POV #1 DOS 02/01/2022 RT ANKLE FRACTURE ORIF W/FIXATION AND SYNDOSMOLI REDUCTION    DOS: 02/01/2022 Procedure: Right ankle fracture ORIF  81 y.o. female returns for post-op check.  Patient states she is doing okay pain is controlled with pain medication.  She has been nonweightbearing to the right lower extremity.  She states the bandages clean dry and intact.  No acute complaints  Review of Systems: Negative except as noted in the HPI. Denies N/V/F/Ch.  Past Medical History:  Diagnosis Date   Cancer ([place])    basal cell skin cancer   GERD (gastroesophageal reflux disease)    Hypothyroidism    h/o no meds   Palpitations    very rarely when she lies down at night    Current Outpatient Medications:    Calcium Carb-Cholecalciferol (CALCIUM 500+D3 PO), Take 1 tablet by mouth daily at 6 (six) AM., Disp: , Rfl:    Cholecalciferol (VITAMIN D-1000 MAX ST) 25 MCG (1000 UT) tablet, Take 1,000 Units by mouth daily., Disp: , Rfl:    Ferrous Sulfate (IRON PO), Take 1 tablet by mouth 3 (three) times a week., Disp: , Rfl:    HYDROcodone-acetaminophen (NORCO) 5-325 MG tablet, Take 1 tablet by mouth every 6 (six) hours as needed for moderate pain., Disp: 30 tablet, Rfl: 0   HYDROcodone-acetaminophen (NORCO/VICODIN) 5-325 MG tablet, Take 1 tablet by mouth daily as needed for moderate pain., Disp: 21 tablet, Rfl: 0   HYDROcodone-acetaminophen (NORCO/VICODIN) 5-325 MG tablet, Take 1 tablet by mouth daily as needed for moderate pain., Disp: 30 tablet, Rfl: 0   HYDROcodone-acetaminophen (NORCO/VICODIN) 5-325 MG tablet, Take 2 tablets by mouth 2 (two) times daily as needed., Disp: 30 tablet, Rfl: 0   ibuprofen (ADVIL) 800 MG tablet, Take 1 tablet (800 mg total) by mouth every 6 (six) hours as needed., Disp: 60 tablet, Rfl: 1   omeprazole (PRILOSEC) 40  MG capsule, Take 40 mg by mouth every morning., Disp: , Rfl:   Social History   Tobacco Use  Smoking Status Never  Smokeless Tobacco Never    Allergies  Allergen Reactions   Penicillins Hives    Other reaction(s): Other (See Comments) unknown    Erythromycin     Unsure of reaction   Objective:  There were no vitals filed for this visit. There is no height or weight on file to calculate BMI. Constitutional Well developed. Well nourished.  Vascular Foot warm and well perfused. Capillary refill normal to all digits.   Neurologic Normal speech. Oriented to person, place, and time. Epicritic sensation to light touch grossly present bilaterally.  Dermatologic Skin healing well without signs of infection. Skin edges well coapted without signs of infection.  Orthopedic: Tenderness to palpation noted about the surgical site.   Radiographs: 3 views of skeletally mature adult right foot: Hardware is intact no signs of loosening or backing out noted.  Good right reduction of ankle mortise noted.  Fracture sites are consolidating no gapping of the syndesmosis noted. Assessment:   1. Closed fracture of right ankle, initial encounter   2. Status post foot surgery    Plan:  Patient was evaluated and treated and all questions answered.  S/p foot surgery right -Progressing as expected post-operatively. -XR: See above -WB Status: Nonweightbearing in right lower extremity -Sutures: Intact no signs of  dehiscence noted no infection noted. -Medications: None -Foot redressed.  No follow-ups on file.

## 2022-02-15 ENCOUNTER — Telehealth: Payer: Self-pay | Admitting: *Deleted

## 2022-02-15 MED ORDER — IBUPROFEN 800 MG PO TABS: 800.0000 mg | ORAL_TABLET | Freq: Four times a day (QID) | ORAL | 1 refills | Status: AC | PRN

## 2022-02-15 MED ORDER — HYDROCODONE-ACETAMINOPHEN 5-325 MG PO TABS: 1.0000 | ORAL_TABLET | Freq: Four times a day (QID) | ORAL | 0 refills | Status: DC | PRN

## 2022-02-15 NOTE — Telephone Encounter (Signed)
Patient's husband is calling for a refills on Hydrocodone-ace,5-'325mg'$  Ibuprofen, 800 mg, Please advise/send to pharmacy on file.

## 2022-02-22 ENCOUNTER — Ambulatory Visit (INDEPENDENT_AMBULATORY_CARE_PROVIDER_SITE_OTHER): Payer: Medicare HMO | Admitting: Podiatry

## 2022-02-22 ENCOUNTER — Encounter: Payer: Self-pay | Admitting: Podiatry

## 2022-02-22 DIAGNOSIS — S82891A Other fracture of right lower leg, initial encounter for closed fracture: Secondary | ICD-10-CM

## 2022-02-22 DIAGNOSIS — Z9889 Other specified postprocedural states: Secondary | ICD-10-CM

## 2022-02-22 MED ORDER — OXYCODONE-ACETAMINOPHEN 5-325 MG PO TABS: 1.0000 | ORAL_TABLET | ORAL | 0 refills | Status: DC | PRN

## 2022-02-22 NOTE — Progress Notes (Signed)
Subjective:  Patient ID: Gina Cline, female    DOB: 02-Apr-1941,  MRN: [id number]  Chief Complaint  Patient presents with   Post-op Follow-up    POV #2DOS 02/01/2022 RT ANKLE FRACTURE ORIF W/FIXATION AND SYNDOSMOLI REDUCTION    DOS: 02/01/2022 Procedure: Right ankle fracture ORIF  81 y.o. female returns for post-op check.  Patient states she is doing okay pain is controlled with pain medication.  She has been nonweightbearing to the right lower extremity.  She states the bandages clean dry and intact.  No acute complaints  Review of Systems: Negative except as noted in the HPI. Denies N/V/F/Ch.  Past Medical History:  Diagnosis Date   Cancer ([place])    basal cell skin cancer   GERD (gastroesophageal reflux disease)    Hypothyroidism    h/o no meds   Palpitations    very rarely when she lies down at night    Current Outpatient Medications:    Calcium Carb-Cholecalciferol (CALCIUM 500+D3 PO), Take 1 tablet by mouth daily at 6 (six) AM., Disp: , Rfl:    Cholecalciferol (VITAMIN D-1000 MAX ST) 25 MCG (1000 UT) tablet, Take 1,000 Units by mouth daily., Disp: , Rfl:    Ferrous Sulfate (IRON PO), Take 1 tablet by mouth 3 (three) times a week., Disp: , Rfl:    HYDROcodone-acetaminophen (NORCO) 5-325 MG tablet, Take 1 tablet by mouth every 6 (six) hours as needed for moderate pain., Disp: 30 tablet, Rfl: 0   HYDROcodone-acetaminophen (NORCO/VICODIN) 5-325 MG tablet, Take 1 tablet by mouth daily as needed for moderate pain., Disp: 21 tablet, Rfl: 0   HYDROcodone-acetaminophen (NORCO/VICODIN) 5-325 MG tablet, Take 1 tablet by mouth daily as needed for moderate pain., Disp: 30 tablet, Rfl: 0   HYDROcodone-acetaminophen (NORCO/VICODIN) 5-325 MG tablet, Take 2 tablets by mouth 2 (two) times daily as needed., Disp: 30 tablet, Rfl: 0   ibuprofen (ADVIL) 800 MG tablet, Take 1 tablet (800 mg total) by mouth every 6 (six) hours as needed., Disp: 60 tablet, Rfl: 1   omeprazole (PRILOSEC) 40 MG  capsule, Take 40 mg by mouth every morning., Disp: , Rfl:    oxyCODONE-acetaminophen (PERCOCET) 5-325 MG tablet, Take 1 tablet by mouth every 4 (four) hours as needed for severe pain., Disp: 30 tablet, Rfl: 0  Social History   Tobacco Use  Smoking Status Never  Smokeless Tobacco Never    Allergies  Allergen Reactions   Penicillins Hives    Other reaction(s): Other (See Comments) unknown    Erythromycin     Unsure of reaction   Objective:  There were no vitals filed for this visit. There is no height or weight on file to calculate BMI. Constitutional Well developed. Well nourished.  Vascular Foot warm and well perfused. Capillary refill normal to all digits.   Neurologic Normal speech. Oriented to person, place, and time. Epicritic sensation to light touch grossly present bilaterally.  Dermatologic Skin mostly reepithelialized.  Staples were left in the areas where they were not ready to come out.  5 degrees of plantarflexion of the ankle joint noted.  Orthopedic: Tenderness to palpation noted about the surgical site.   Radiographs: 3 views of skeletally mature adult right foot: Hardware is intact no signs of loosening or backing out noted.  Good right reduction of ankle mortise noted.  Fracture sites are consolidating no gapping of the syndesmosis noted. Assessment:   1. Closed fracture of right ankle, initial encounter   2. Status post foot surgery  Plan:  Patient was evaluated and treated and all questions answered.  S/p foot surgery right -Progressing as expected post-operatively. -XR: See above -WB Status: Nonweightbearing in right lower extremity -Sutures: Partially removed.  No signs of dehiscence noted no infection noted. -Medications: None -We will begin physical therapy once the skin has completely reepithelialized.  She is healing slightly slower given her age.  No follow-ups on file.

## 2022-03-08 ENCOUNTER — Ambulatory Visit (INDEPENDENT_AMBULATORY_CARE_PROVIDER_SITE_OTHER): Payer: Medicare HMO

## 2022-03-08 ENCOUNTER — Ambulatory Visit (INDEPENDENT_AMBULATORY_CARE_PROVIDER_SITE_OTHER): Payer: Medicare HMO | Admitting: Podiatry

## 2022-03-08 DIAGNOSIS — Z9889 Other specified postprocedural states: Secondary | ICD-10-CM

## 2022-03-08 DIAGNOSIS — S82891A Other fracture of right lower leg, initial encounter for closed fracture: Secondary | ICD-10-CM

## 2022-03-08 DIAGNOSIS — S93431A Sprain of tibiofibular ligament of right ankle, initial encounter: Secondary | ICD-10-CM

## 2022-03-08 NOTE — Progress Notes (Signed)
Subjective:  Patient ID: Gina Cline, female    DOB: 1940-12-12,  MRN: [id number]  Chief Complaint  Patient presents with   Routine Post Op    DOS: 02/01/2022 Procedure: Right ankle fracture ORIF  81 y.o. female returns for post-op check.  Patient states she is doing okay pain is controlled with pain medication.  She has been nonweightbearing to the right lower extremity.  She denies any other current minimal pain the pain has improved considerably.  Review of Systems: Negative except as noted in the HPI. Denies N/V/F/Ch.  Past Medical History:  Diagnosis Date   Cancer ([place])    basal cell skin cancer   GERD (gastroesophageal reflux disease)    Hypothyroidism    h/o no meds   Palpitations    very rarely when she lies down at night    Current Outpatient Medications:    Calcium Carb-Cholecalciferol (CALCIUM 500+D3 PO), Take 1 tablet by mouth daily at 6 (six) AM., Disp: , Rfl:    Cholecalciferol (VITAMIN D-1000 MAX ST) 25 MCG (1000 UT) tablet, Take 1,000 Units by mouth daily., Disp: , Rfl:    Ferrous Sulfate (IRON PO), Take 1 tablet by mouth 3 (three) times a week., Disp: , Rfl:    HYDROcodone-acetaminophen (NORCO) 5-325 MG tablet, Take 1 tablet by mouth every 6 (six) hours as needed for moderate pain., Disp: 30 tablet, Rfl: 0   HYDROcodone-acetaminophen (NORCO/VICODIN) 5-325 MG tablet, Take 1 tablet by mouth daily as needed for moderate pain., Disp: 21 tablet, Rfl: 0   HYDROcodone-acetaminophen (NORCO/VICODIN) 5-325 MG tablet, Take 1 tablet by mouth daily as needed for moderate pain., Disp: 30 tablet, Rfl: 0   HYDROcodone-acetaminophen (NORCO/VICODIN) 5-325 MG tablet, Take 2 tablets by mouth 2 (two) times daily as needed., Disp: 30 tablet, Rfl: 0   ibuprofen (ADVIL) 800 MG tablet, Take 1 tablet (800 mg total) by mouth every 6 (six) hours as needed., Disp: 60 tablet, Rfl: 1   omeprazole (PRILOSEC) 40 MG capsule, Take 40 mg by mouth every morning., Disp: , Rfl:     oxyCODONE-acetaminophen (PERCOCET) 5-325 MG tablet, Take 1 tablet by mouth every 4 (four) hours as needed for severe pain., Disp: 30 tablet, Rfl: 0  Social History   Tobacco Use  Smoking Status Never  Smokeless Tobacco Never    Allergies  Allergen Reactions   Penicillins Hives    Other reaction(s): Other (See Comments) unknown    Erythromycin     Unsure of reaction   Objective:  There were no vitals filed for this visit. There is no height or weight on file to calculate BMI. Constitutional Well developed. Well nourished.  Vascular Foot warm and well perfused. Capillary refill normal to all digits.   Neurologic Normal speech. Oriented to person, place, and time. Epicritic sensation to light touch grossly present bilaterally.  Dermatologic Skin mostly reepithelialized.  Everything was removed.  No signs of Deis is noted no complication noted.  Good range of motion noted at the ankle joint 6 degrees past neutral.  Orthopedic: Tenderness to palpation noted about the surgical site.   Radiographs: 3 views of skeletally mature adult right foot: Hardware is intact no signs of loosening or backing out noted.  Good right reduction of ankle mortise noted.  Fracture sites are consolidating no gapping of the syndesmosis noted. Assessment:   1. Closed fracture of right ankle, initial encounter   2. Status post foot surgery   3. Syndesmotic disruption of right ankle, initial encounter  Plan:  Patient was evaluated and treated and all questions answered.  S/p foot surgery right -Progressing as expected post-operatively. -XR: See above -WB Status: Begin weightbearing as tolerated in the right lower extremity with a cam boot -Sutures: Reepithelialized none -Medications: None -Prescription for physical therapy was given to start immediately.  She can be weightbearing as tolerated with a cam boot  No follow-ups on file.

## 2022-03-11 ENCOUNTER — Telehealth: Payer: Self-pay | Admitting: Podiatry

## 2022-03-11 NOTE — Telephone Encounter (Signed)
Patient daughter called and stated that she got a prescription for Physical Therapy, they have a appointment scheduled for [facility] in [facility] for Monday 03/11/2022 at 1:45. She is needing the office notes and Op notes faxed to [phone]

## 2022-03-19 NOTE — Telephone Encounter (Signed)
Notes faxed.

## 2022-03-20 ENCOUNTER — Telehealth: Payer: Self-pay | Admitting: *Deleted

## 2022-03-20 NOTE — Telephone Encounter (Signed)
Called and spoke with daughter, they did not take her mom to [facility], but to [id number],[address] , [facility] PT.she has had 3 visits and notes are in epic to view

## 2022-03-20 NOTE — Telephone Encounter (Signed)
Spoke with daughter, patient has been seen by [address], 3 visits, reports are ready to view in epic.

## 2022-03-26 ENCOUNTER — Encounter: Payer: Self-pay | Admitting: Podiatry

## 2022-04-03 ENCOUNTER — Ambulatory Visit (INDEPENDENT_AMBULATORY_CARE_PROVIDER_SITE_OTHER): Payer: Medicare HMO

## 2022-04-03 ENCOUNTER — Ambulatory Visit (INDEPENDENT_AMBULATORY_CARE_PROVIDER_SITE_OTHER): Payer: Medicare HMO | Admitting: Podiatry

## 2022-04-03 DIAGNOSIS — S82891A Other fracture of right lower leg, initial encounter for closed fracture: Secondary | ICD-10-CM

## 2022-04-03 NOTE — Progress Notes (Signed)
Subjective:  Patient ID: Gina Cline, female    DOB: 1941/05/02,  MRN: [id number]  Chief Complaint  Patient presents with   Routine Post Op      POV #4 DOS 02/01/2022 RT ANKLE FRACTURE ORIF W/FIXATION AND SYNDOSMOLI REDUCTION    DOS: 02/01/2022 Procedure: Right ankle fracture ORIF  81 y.o. female returns for post-op check.  Patient states she has not been taking any medication she is been doing physical therapy.  She is having some wound complication likely due to her age with boot rubbing against it.  Denies any nausea fever chills vomiting. Review of Systems: Negative except as noted in the HPI. Denies N/V/F/Ch.  Past Medical History:  Diagnosis Date   Cancer ([place])    basal cell skin cancer   GERD (gastroesophageal reflux disease)    Hypothyroidism    h/o no meds   Palpitations    very rarely when she lies down at night    Current Outpatient Medications:    Calcium Carb-Cholecalciferol (CALCIUM 500+D3 PO), Take 1 tablet by mouth daily at 6 (six) AM., Disp: , Rfl:    Cholecalciferol (VITAMIN D-1000 MAX ST) 25 MCG (1000 UT) tablet, Take 1,000 Units by mouth daily., Disp: , Rfl:    Ferrous Sulfate (IRON PO), Take 1 tablet by mouth 3 (three) times a week., Disp: , Rfl:    HYDROcodone-acetaminophen (NORCO) 5-325 MG tablet, Take 1 tablet by mouth every 6 (six) hours as needed for moderate pain., Disp: 30 tablet, Rfl: 0   HYDROcodone-acetaminophen (NORCO/VICODIN) 5-325 MG tablet, Take 1 tablet by mouth daily as needed for moderate pain., Disp: 21 tablet, Rfl: 0   HYDROcodone-acetaminophen (NORCO/VICODIN) 5-325 MG tablet, Take 1 tablet by mouth daily as needed for moderate pain., Disp: 30 tablet, Rfl: 0   HYDROcodone-acetaminophen (NORCO/VICODIN) 5-325 MG tablet, Take 2 tablets by mouth 2 (two) times daily as needed., Disp: 30 tablet, Rfl: 0   ibuprofen (ADVIL) 800 MG tablet, Take 1 tablet (800 mg total) by mouth every 6 (six) hours as needed., Disp: 60 tablet, Rfl: 1   omeprazole  (PRILOSEC) 40 MG capsule, Take 40 mg by mouth every morning., Disp: , Rfl:    oxyCODONE-acetaminophen (PERCOCET) 5-325 MG tablet, Take 1 tablet by mouth every 4 (four) hours as needed for severe pain., Disp: 30 tablet, Rfl: 0  Social History   Tobacco Use  Smoking Status Never  Smokeless Tobacco Never    Allergies  Allergen Reactions   Penicillins Hives    Other reaction(s): Other (See Comments) unknown    Erythromycin     Unsure of reaction   Objective:  There were no vitals filed for this visit. There is no height or weight on file to calculate BMI. Constitutional Well developed. Well nourished.  Vascular Foot warm and well perfused. Capillary refill normal to all digits.   Neurologic Normal speech. Oriented to person, place, and time. Epicritic sensation to light touch grossly present bilaterally.  Dermatologic Superficial dehiscence likely due to either the boot rubbing against the incision site or hardware pressing against the skin.  No clinical signs of infection noted.  No exposure of hardware noted.  Good range of motion noted at the ankle joint 10 degrees past neutral.  Orthopedic: Tenderness to palpation noted about the surgical site.   Radiographs: 3 views of skeletally mature adult right foot: Hardware is intact no signs of loosening or backing out noted.  Good right reduction of ankle mortise noted.  Fracture sites are consolidating no gapping of  the syndesmosis noted. Assessment:   No diagnosis found.   Plan:  Patient was evaluated and treated and all questions answered.  S/p foot surgery right -Progressing as expected post-operatively. -XR: See above -WB Status: Weightbearing as tolerated in regular shoe -Sutures: Some small areas of superficial dehiscence noted.  Go back to doing Betadine wet-to-dry dressing -Medications: None -Continue physical therapy. -If the incision has completely reepithelialized patient will be officially discharged from my care  during next clinical visit in 4 weeks  No follow-ups on file.

## 2022-04-03 NOTE — Telephone Encounter (Signed)
Patient seen 04/03/22

## 2022-04-09 ENCOUNTER — Encounter: Payer: Self-pay | Admitting: Podiatry

## 2022-04-09 ENCOUNTER — Encounter: Payer: Self-pay | Admitting: Emergency Medicine

## 2022-04-09 ENCOUNTER — Ambulatory Visit
Admission: EM | Admit: 2022-04-09 | Discharge: 2022-04-09 | Disposition: A | Discharge status: Home / Self Care | Payer: Medicare HMO | Attending: Family Medicine | Admitting: Family Medicine

## 2022-04-09 DIAGNOSIS — L03115 Cellulitis of right lower limb: Secondary | ICD-10-CM | POA: Diagnosis not present

## 2022-04-09 MED ORDER — DOXYCYCLINE HYCLATE 100 MG PO CAPS: 100.0000 mg | ORAL_CAPSULE | Freq: Two times a day (BID) | ORAL | 0 refills | Status: AC

## 2022-04-09 NOTE — ED Triage Notes (Signed)
Patient states that she broke her ankle in June, but the incision that was made on right ankle, it has some redness, drainage and would like someone to take a look at it.  Pt had surgery on the ankle on 02/01/22.

## 2022-04-09 NOTE — Discharge Instructions (Addendum)
Advised patient to take medication as directed with food to completion.  Encouraged patient to increase daily water intake while taking this medication.  Advised patient if symptoms worsen and/or unresolved please follow-up with PCP or here for further evaluation.

## 2022-04-09 NOTE — ED Provider Notes (Signed)
[name]    CSN: [id number] Arrival date & time: 04/09/22  1627      History   Chief Complaint Chief Complaint  Patient presents with   Sore on right ankle    HPI Gina Cline is a 81 y.o. female.   HPI Pleasant 81 year old female presents with redness of the right ankle with drainage and is concerned about possible skin infection.  Patient reports broke her ankle in June and surgery on this ankle was performed on 02/01/2022.  PMH significant for BCC and hypothyroidism.  Patient is accompanied by her husband this evening.  Past Medical History:  Diagnosis Date   Cancer ([place])    basal cell skin cancer   GERD (gastroesophageal reflux disease)    Hypothyroidism    h/o no meds   Palpitations    very rarely when she lies down at night    There are no problems to display for this patient.   Past Surgical History:  Procedure Laterality Date   COLONOSCOPY     MOHS SURGERY     ORIF ANKLE FRACTURE Right 02/01/2022   Procedure: OPEN REDUCTION INTERNAL FIXATION (ORIF) RIGHT ANKLE FRACTURE;  Surgeon: Felipa Furnace, DPM;  Location: [facility] ORS;  Service: Orthopedics;  Laterality: Right;  CHOICE WITH BLOCK    OB History   No obstetric history on file.      Home Medications    Prior to Admission medications   Medication Sig Start Date End Date Taking? Authorizing Provider  Calcium Carb-Cholecalciferol (CALCIUM 500+D3 PO) Take 1 tablet by mouth daily at 6 (six) AM.   Yes [provider]  Cholecalciferol (VITAMIN D-1000 MAX ST) 25 MCG (1000 UT) tablet Take 1,000 Units by mouth daily.   Yes [provider]  Ferrous Sulfate (IRON PO) Take 1 tablet by mouth 3 (three) times a week.   Yes [provider]  HYDROcodone-acetaminophen (NORCO) 5-325 MG tablet Take 1 tablet by mouth every 6 (six) hours as needed for moderate pain. 02/15/22  Yes Felipa Furnace, DPM  HYDROcodone-acetaminophen (NORCO/VICODIN) 5-325 MG tablet Take 1 tablet by mouth daily  as needed for moderate pain. 01/23/22  Yes Eliezer Lofts, FNP  HYDROcodone-acetaminophen (NORCO/VICODIN) 5-325 MG tablet Take 1 tablet by mouth daily as needed for moderate pain. 01/23/22  Yes Eliezer Lofts, FNP  HYDROcodone-acetaminophen (NORCO/VICODIN) 5-325 MG tablet Take 2 tablets by mouth 2 (two) times daily as needed. 01/28/22  Yes Eliezer Lofts, FNP  ibuprofen (ADVIL) 800 MG tablet Take 1 tablet (800 mg total) by mouth every 6 (six) hours as needed. 02/15/22  Yes Felipa Furnace, DPM  omeprazole (PRILOSEC) 40 MG capsule Take 40 mg by mouth every morning.   Yes [provider]  oxyCODONE-acetaminophen (PERCOCET) 5-325 MG tablet Take 1 tablet by mouth every 4 (four) hours as needed for severe pain. 02/22/22  Yes Felipa Furnace, DPM  doxycycline (VIBRAMYCIN) 100 MG capsule Take 1 capsule (100 mg total) by mouth 2 (two) times daily for 10 days. 04/09/22 04/19/22 Yes Eliezer Lofts, [place]    Family History History reviewed. No pertinent family history.  Social History Social History   Tobacco Use   Smoking status: Never   Smokeless tobacco: Never  Vaping Use   Vaping Use: Never used  Substance Use Topics   Alcohol use: Not Currently     Allergies   Penicillins and Erythromycin   Review of Systems Review of Systems  Skin:  Positive for rash.  All other systems reviewed and  are negative.    Physical Exam Triage Vital Signs ED Triage Vitals  Enc Vitals Group     BP 04/09/22 1653 (!) 157/80     Pulse Rate 04/09/22 1653 81     Resp 04/09/22 1653 18     Temp 04/09/22 1653 99.5 F (37.5 C)     Temp Source 04/09/22 1653 Oral     SpO2 04/09/22 1653 96 %     Weight --      Height --      Head Circumference --      Peak Flow --      Pain Score 04/09/22 1654 0     Pain Loc --      Pain Edu? --      Excl. in [place]? --    No data found.  Updated Vital Signs BP (!) 157/80 (BP Location: Left Arm)   Pulse 81   Temp 99.5 F (37.5 C) (Oral)   Resp 18   SpO2 96%       Physical Exam Vitals and nursing note reviewed.  Constitutional:      Appearance: Normal appearance. She is normal weight.  HENT:     Head: Normocephalic and atraumatic.     Mouth/Throat:     Mouth: Mucous membranes are moist.     Pharynx: Oropharynx is clear.  Eyes:     Extraocular Movements: Extraocular movements intact.     Conjunctiva/sclera: Conjunctivae normal.     Pupils: Pupils are equal, round, and reactive to light.  Cardiovascular:     Rate and Rhythm: Normal rate and regular rhythm.     Pulses: Normal pulses.     Heart sounds: Normal heart sounds.  Pulmonary:     Effort: Pulmonary effort is normal.     Breath sounds: Normal breath sounds. No wheezing, rhonchi or rales.  Musculoskeletal:     Cervical back: Normal range of motion and neck supple.  Skin:    General: Skin is warm.     Comments: Right ankle lateral aspect: Please see image inserted below  Neurological:     General: No focal deficit present.     Mental Status: She is alert and oriented to person, place, and time. Mental status is at baseline.       UC Treatments / Results  Labs (all labs ordered are listed, but only abnormal results are displayed) Labs Reviewed - No data to display  EKG   Radiology No results found.  Procedures Procedures (including critical care time)  Medications Ordered in UC Medications - No data to display  Initial Impression / Assessment and Plan / UC Course  I have reviewed the triage vital signs and the nursing notes.  Pertinent labs & imaging results that were available during my care of the patient were reviewed by me and considered in my medical decision making (see chart for details).     MDM: 1.  Cellulitis of right ankle-Rx'd Doxycycline. Advised patient to take medication as directed with food to completion.  Encouraged patient to increase daily water intake while taking this medication.  Advised patient if symptoms worsen and/or unresolved please  follow-up with PCP or here for further evaluation.  Patient discharged home, hemodynamically stable. Final Clinical Impressions(s) / UC Diagnoses   Final diagnoses:  Cellulitis of right ankle     Discharge Instructions      Advised patient to take medication as directed with food to completion.  Encouraged patient to increase daily water intake while  taking this medication.  Advised patient if symptoms worsen and/or unresolved please follow-up with PCP or here for further evaluation.     ED Prescriptions     Medication Sig Dispense Auth. Provider   doxycycline (VIBRAMYCIN) 100 MG capsule Take 1 capsule (100 mg total) by mouth 2 (two) times daily for 10 days. 20 capsule Eliezer Lofts, FNP      PDMP not reviewed this encounter.   Eliezer Lofts, [place] 04/09/22 1747

## 2022-04-13 ENCOUNTER — Emergency Department (HOSPITAL_BASED_OUTPATIENT_CLINIC_OR_DEPARTMENT_OTHER)
Admission: EM | Admit: 2022-04-13 | Discharge: 2022-04-13 | Disposition: A | Discharge status: Home / Self Care | Payer: Medicare HMO | Attending: Emergency Medicine | Admitting: Emergency Medicine

## 2022-04-13 ENCOUNTER — Encounter (HOSPITAL_BASED_OUTPATIENT_CLINIC_OR_DEPARTMENT_OTHER): Payer: Self-pay | Admitting: Emergency Medicine

## 2022-04-13 ENCOUNTER — Other Ambulatory Visit: Payer: Self-pay

## 2022-04-13 DIAGNOSIS — L03115 Cellulitis of right lower limb: Secondary | ICD-10-CM | POA: Diagnosis not present

## 2022-04-13 NOTE — ED Provider Notes (Signed)
[facility] Provider Note   CSN: [id number] Arrival date & time: 04/13/22  1704     History  Chief Complaint  Patient presents with   Ankle Pain    Gina Cline is a 81 y.o. female.  With history of ORIF of right ankle fracture on 02/01/2022 who presents to the ED for evaluation of right ankle erythema.  She was seen on 04/09/2022 at urgent care and it was determined the patient has cellulitis of the surgical scar on the right lateral aspect just superior to the lateral malleolus.  Pictures were taken at that time and patient was started on doxycycline.  She then followed up with her primary care provider who put in a referral for her to see wound care clinic, she called for an appointment but the soonest they can get her in was September 19.  She presents today for reevaluation of his infection and to see if there is any way that she could be seen sooner in the wound care clinic by being seen in the ED.  Patient reports erythema has not increased, although she still has warmth in the area as well as a small amount of purulent discharge.  Denies fevers, chills, night sweats, nausea, vomiting, abdominal pain, numbness, weakness, tingling, red streaking in the area of question.  Patient takes Tylenol at home for the pain and reports this is working well.   Ankle Pain      Home Medications Prior to Admission medications   Medication Sig Start Date End Date Taking? Authorizing Provider  Calcium Carb-Cholecalciferol (CALCIUM 500+D3 PO) Take 1 tablet by mouth daily at 6 (six) AM.    [provider]  Cholecalciferol (VITAMIN D-1000 MAX ST) 25 MCG (1000 UT) tablet Take 1,000 Units by mouth daily.    [provider]  doxycycline (VIBRAMYCIN) 100 MG capsule Take 1 capsule (100 mg total) by mouth 2 (two) times daily for 10 days. 04/09/22 04/19/22  Eliezer Lofts, FNP  Ferrous Sulfate (IRON PO) Take 1 tablet by mouth 3 (three) times a week.    [provider]  HYDROcodone-acetaminophen (NORCO) 5-325 MG tablet Take 1 tablet by mouth every 6 (six) hours as needed for moderate pain. 02/15/22   Felipa Furnace, DPM  HYDROcodone-acetaminophen (NORCO/VICODIN) 5-325 MG tablet Take 1 tablet by mouth daily as needed for moderate pain. 01/23/22   Eliezer Lofts, FNP  HYDROcodone-acetaminophen (NORCO/VICODIN) 5-325 MG tablet Take 1 tablet by mouth daily as needed for moderate pain. 01/23/22   Eliezer Lofts, FNP  HYDROcodone-acetaminophen (NORCO/VICODIN) 5-325 MG tablet Take 2 tablets by mouth 2 (two) times daily as needed. 01/28/22   Eliezer Lofts, FNP  ibuprofen (ADVIL) 800 MG tablet Take 1 tablet (800 mg total) by mouth every 6 (six) hours as needed. 02/15/22   Felipa Furnace, DPM  omeprazole (PRILOSEC) 40 MG capsule Take 40 mg by mouth every morning.    [provider]  oxyCODONE-acetaminophen (PERCOCET) 5-325 MG tablet Take 1 tablet by mouth every 4 (four) hours as needed for severe pain. 02/22/22   Felipa Furnace, DPM      Allergies    Penicillins and Erythromycin    Review of Systems   Review of Systems  Musculoskeletal:  Positive for myalgias.  All other systems reviewed and are negative.   Physical Exam Updated Vital Signs BP (!) 141/79 (BP Location: Left Arm)   Pulse 79   Temp 98 F (36.7 C) (Oral)   Resp 16  Ht '5\' 3"'$  (1.6 m)   Wt 54.4 kg   SpO2 97%   BMI 21.26 kg/m  Physical Exam Vitals and nursing note reviewed.  Constitutional:      General: She is not in acute distress.    Appearance: Normal appearance. She is well-developed. She is not ill-appearing.  HENT:     Head: Normocephalic and atraumatic.  Eyes:     Conjunctiva/sclera: Conjunctivae normal.  Cardiovascular:     Rate and Rhythm: Normal rate and regular rhythm.     Pulses: Normal pulses.          Dorsalis pedis pulses are 2+ on the right side and 2+ on the left side.     Heart sounds: No murmur heard. Pulmonary:     Effort: Pulmonary effort is  normal. No respiratory distress.  Abdominal:     Palpations: Abdomen is soft.  Musculoskeletal:        General: No swelling.     Cervical back: Neck supple.     Right lower leg: No edema.     Left lower leg: No edema.       Feet:  Feet:     Comments: Mild surrounding erythema of the surgical site with no discharge, patient had skin markings that her husband placed from 2 days ago, erythema does not extend beyond this.  Strength, sensation, range of motion intact. Skin:    General: Skin is warm and dry.     Capillary Refill: Capillary refill takes less than 2 seconds.  Neurological:     Mental Status: She is alert.  Psychiatric:        Mood and Affect: Mood normal.     ED Results / Procedures / Treatments   Labs (all labs ordered are listed, but only abnormal results are displayed) Labs Reviewed - No data to display  EKG None  Radiology No results found.  Procedures Procedures    Medications Ordered in ED Medications - No data to display  ED Course/ Medical Decision Making/ A&P                           Medical Decision Making This patient presents to the ED for concern of right ankle cellulitis the differential diagnosis includes cellulitis, right ankle sprain   Co morbidities that complicate the patient evaluation  ORIF on 02/01/2022  Additional history obtained from: Nursing notes from this visit. Prior ED visit on 04/09/2022 for evaluation of cellulitis Previous records within EMR system office visit on 04/11/2022 with her primary care provider who set patient up with wound care  I ordered, reviewed and interpreted labs which include: Patient declined basic labs today.  Afebrile, hemodynamically stable.  Per skin markings and chart review showing initial image from 04/09/2022, cellulitis appears to be resolving on doxycycline.  Patient denies new or worsening symptoms.  She denied basic labs and I also do not see signs or symptoms of systemic infection. she would  like to be seen by wound care earlier if possible.  I placed an ambulatory referral order for wound care, but discussed with patient that they may not be able to get her in anytime sooner than what she already has scheduled, even though she was seen in the ED.  Patient was given strict return precautions.  Entire affected area was outlined with a skin marker today. patient was agreeable to plan.  Stable at discharge.  At this time there does not appear to  be any evidence of an acute emergency medical condition and the patient appears stable for discharge with appropriate outpatient follow up. Diagnosis was discussed with patient who verbalizes understanding of care plan and is agreeable to discharge. I have discussed return precautions with patient and daughter who verbalizes understanding. Patient encouraged to follow-up with their PCP within 1 week. All questions answered.  Patient's case discussed with Dr. Maryan Rued who agrees with plan to discharge with follow-up.   Note: Portions of this report may have been transcribed using voice recognition software. Every effort was made to ensure accuracy; however, inadvertent computerized transcription errors may still be present.          Final Clinical Impression(s) / ED Diagnoses Final diagnoses:  Cellulitis of right lower extremity    Rx / DC Orders ED Discharge Orders          Ordered    Ambulatory referral to Wound Clinic        04/13/22 1908              Nehemiah Massed 04/13/22 1950    Blanchie Dessert, MD 04/16/22 1510

## 2022-04-13 NOTE — ED Triage Notes (Signed)
Patient states she is currently on an antibiotic and was told to follow up with wound care due to redness and drainage to right ankle. States wound care cannot get her in for 10 days and is concerned that her infection will worsen.

## 2022-04-13 NOTE — Discharge Instructions (Addendum)
You have been seen today for your complaint of right ankle infection. Your discharge medications include doxycycline.  This was prescribed to you by urgent care.  You should continue taking it as prescribed.. Home care instructions are as follows:  You should continue to monitor closely for signs of increasing redness, warmth, drainage Follow up with: Wound care.  We put an ambulatory referral order in, but they may not get to a sooner visit than what you have. Please seek immediate medical care if you develop any of the following symptoms: Your symptoms get worse. You feel very sleepy. You develop vomiting or diarrhea that persists. You notice red streaks coming from the infected area. Your red area gets larger or turns dark in color. At this time there does not appear to be the presence of an emergent medical condition, however there is always the potential for conditions to change. Please read and follow the below instructions.  Do not take your medicine if  develop an itchy rash, swelling in your mouth or lips, or difficulty breathing; call 911 and seek immediate emergency medical attention if this occurs.  You may review your lab tests and imaging results in their entirety on your MyChart account.  Please discuss all results of fully with your primary care provider and other specialist at your follow-up visit.  Note: Portions of this text may have been transcribed using voice recognition software. Every effort was made to ensure accuracy; however, inadvertent computerized transcription errors may still be present.

## 2022-04-15 ENCOUNTER — Encounter: Payer: Self-pay | Admitting: Podiatry

## 2022-04-15 DIAGNOSIS — L97311 Non-pressure chronic ulcer of right ankle limited to breakdown of skin: Secondary | ICD-10-CM

## 2022-04-18 ENCOUNTER — Encounter (HOSPITAL_BASED_OUTPATIENT_CLINIC_OR_DEPARTMENT_OTHER): Payer: Medicare HMO | Attending: Internal Medicine | Admitting: Internal Medicine

## 2022-04-18 DIAGNOSIS — L97811 Non-pressure chronic ulcer of other part of right lower leg limited to breakdown of skin: Secondary | ICD-10-CM | POA: Diagnosis present

## 2022-04-18 DIAGNOSIS — L03115 Cellulitis of right lower limb: Secondary | ICD-10-CM | POA: Insufficient documentation

## 2022-04-18 DIAGNOSIS — L97918 Non-pressure chronic ulcer of unspecified part of right lower leg with other specified severity: Secondary | ICD-10-CM | POA: Insufficient documentation

## 2022-04-18 DIAGNOSIS — E039 Hypothyroidism, unspecified: Secondary | ICD-10-CM | POA: Diagnosis not present

## 2022-04-18 DIAGNOSIS — I10 Essential (primary) hypertension: Secondary | ICD-10-CM | POA: Diagnosis not present

## 2022-04-19 NOTE — Progress Notes (Signed)
Gina Cline ([id number]) , Visit Report for 04/18/2022 Chief Complaint Document Details Patient Name: Date of Service: CHEN, HOLZMAN 04/18/2022 9:45 A M Medical Record Number: [id number] Patient Account Number: [id number] Date of Birth/Sex: Treating RN: December 02, 1940 (81 y.o. [name] Primary Care Provider: MA Ok Edwards MES Other Clinician: Referring Provider: Treating Provider/Extender: Williemae Area in Treatment: 0 Information Obtained from: Patient Chief Complaint 04/18/22; patient is here for review of 2 small wounds on the right lateral lower leg Electronic Signature(s) Signed: 04/18/2022 4:35:07 PM By: Linton Ham MD Entered By: Linton Ham on 04/18/2022 11:45:34 -------------------------------------------------------------------------------- Debridement Details Patient Name: Date of Service: Gina Cline 04/18/2022 9:45 A M Medical Record Number: [id number] Patient Account Number: [id number] Date of Birth/Sex: Treating RN: 08/15/1940 (81 y.o. [name] Primary Care Provider: MA Ok Edwards MES Other Clinician: Referring Provider: Treating Provider/Extender: Williemae Area in Treatment: 0 Debridement Performed for Assessment: Wound #1 Right,Lateral Ankle Performed By: Clinician Deon Pilling, RN Debridement Type: Chemical/Enzymatic/Mechanical Agent Used: gauze and wound cleanser Level of Consciousness (Pre-procedure): Awake and Alert Pre-procedure Verification/Time Out No Taken: Bleeding: None Response to Treatment: Procedure was tolerated well Level of Consciousness (Post- Awake and Alert procedure): Post Debridement Measurements of Total Wound Length: (cm) 1.2 Width: (cm) 0.6 Depth: (cm) 0.4 Volume: (cm) 0.226 Character of Wound/Ulcer Post Debridement: Stable Post Procedure Diagnosis Same as Pre-procedure Electronic Signature(s) Signed: 04/18/2022 4:35:07 PM By: Linton Ham MD Signed: 04/19/2022  5:40:20 PM By: Deon Pilling RN, BSN Entered By: Linton Ham on 04/18/2022 11:43:55 -------------------------------------------------------------------------------- HPI Details Patient Name: Date of Service: Gina Cline, Gina Cline 04/18/2022 9:45 A M Medical Record Number: [id number] Patient Account Number: [id number] Date of Birth/Sex: Treating RN: Apr 04, 1941 (81 y.o. [name] Primary Care Provider: MA Ok Edwards MES Other Clinician: Referring Provider: Treating Provider/Extender: Williemae Area in Treatment: 0 History of Present Illness HPI Description: Admission 04/18/2022 This is a 81 year old healthy woman who had a fall on 02/01/2022 she underwent ORIF of the right ankle bimalleolar fracture surgery by Dr. Posey Pronto of podiatry. The last time that she saw him she says that he use Betadine wet-to-dry. She spent some time at the beach but did not get her leg in water was seen on 04/09/2022 in urgent care with significant erythema of the right ankle lateral aspect. She was put on doxycycline at that point and the erythema seems to have improved. She is not complaining of a lot of pain but she does have drainage from the superior wound. She has not been systemically unwell. 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 She is not a diabeticOr a smoker. ABI in our clinic was 1.18 on the right Electronic Signature(s) Signed: 04/18/2022 4:35:07 PM By: Linton Ham MD Entered By: Linton Ham on 04/18/2022 11:50:22 -------------------------------------------------------------------------------- Physical Exam Details Patient Name: Date of Service: Gina Cline, Gina Cline [place] 04/18/2022 9:45 A M Medical Record Number: [id number] Patient Account Number: [id number] Date of Birth/Sex: Treating RN: 05-09-1941 (81 y.o. [name] Primary Care  Provider: MA Ok Edwards MES Other Clinician: Referring Provider: Treating Provider/Extender: Williemae Area in Treatment: 0 Constitutional Sitting or standing Blood Pressure is within target range for patient.. Pulse regular and within target range for patient.Marland Kitchen Respirations regular, non-labored and within target range.. Temperature is normal and within the target range for the  patient.Marland Kitchen Appears in no distress. Cardiovascular Pedal pulses are normal. No edema in the right leg. Notes Wound exam; the patient has 2 small wounds on the lateral aspect of the right lower leg/ankle. The most concerning 1 is the superior wound which I believe probes to hardware. This is small but deep. There is no overt purulence. The more distal wound I believe has exposed tendon and I did not debride this either. It is also small but has much less depth. The area of the erythema that was marked after she was in the ER on 9/5 has receded quite a bit certainly not expanded Electronic Signature(s) Signed: 04/18/2022 4:35:07 PM By: Linton Ham MD Entered By: Linton Ham on 04/18/2022 11:51:54 -------------------------------------------------------------------------------- Physician Orders Details Patient Name: Date of Service: Gina Cline, Gina Cline [place] 04/18/2022 9:45 A M Medical Record Number: [id number] Patient Account Number: [id number] Date of Birth/Sex: Treating RN: 04-Apr-1941 (82 y.o. [name] Primary Care Provider: MA Ok Edwards MES Other Clinician: Referring Provider: Treating Provider/Extender: Williemae Area in Treatment: 0 Verbal / Phone Orders: No Diagnosis Coding Follow-up Appointments ppointment in 1 week. - Dr. Heber Dickens and Tammi Klippel, Room 8 Return A Other: - Call Dr. Posey Pronto related to hardware and tendon exposed. Will send the referral today. Call Dr. Posey Pronto office if no one has called you by Monday. Supplies will come to your house from [employer]. Anesthetic (In clinic) Topical Lidocaine 4% applied to wound bed Bathing/ Shower/ Hygiene May shower and wash wound with soap and water. Edema Control - Lymphedema / SCD / Other Avoid standing for long periods of time. Wound Treatment Wound #1 - Ankle Wound Laterality: Right, Lateral Cleanser: Soap and Water Every Other Day/30 Days Discharge Instructions: May shower and wash wound with dial antibacterial soap and water prior to dressing change. Peri-Wound Care: Skin Prep Every Other Day/30 Days Discharge Instructions: Use skin prep as directed Prim Dressing: Promogran Prisma Matrix, 4.34 (sq in) (silver collagen) (Dispense As Written) Every Other Day/30 Days ary Discharge Instructions: Moisten collagen with [place] Jelly. Purchase [place] Jelly at over the store. Secondary Dressing: Zetuvit Plus Silicone Border Dressing 5x5 (in/in) Every Other Day/30 Days Discharge Instructions: Apply silicone border over primary dressing as directed. Consults Podiatry-Dr. Posey Pronto - ***Urgent**** Dr. Posey Pronto at [facility] patient right ankle ORIF on 02/01/2022. Patient now has exposed hardware and tendon. Please get patient in urgently to see Dr. Posey Pronto. Laboratory naerobe culture (MICRO) - Right ankle wound culture. Bacteria identified in Unspecified specimen by A LOINC Code: 983-3 Convenience Name: Anaerobic culture Patient Medications llergies: penicillin, erythromycin base A Notifications Medication Indication Start End 04/18/2022 lidocaine DOSE topical 4 % cream - cream topical applied in clinic only. Electronic Signature(s) Signed: 04/18/2022 4:35:07 PM By: Linton Ham MD Signed: 04/19/2022 5:40:20 PM By: Deon Pilling RN, BSN Entered By: Deon Pilling on 04/18/2022 10:47:25 Prescription 04/18/2022 Glenetta Borg -------------------------------------------------------------------------------- , Linton Ham MD Patient Name: Provider: 1941/03/28 [id number] Date of  Birth: NPI#Wanda Plump [id number] Sex: DEA #: [phone] Phone #: License #: [place] Patient Address: [address] [place], [place] [address] [place], [place] [address] [phone] Allergies penicillin; erythromycin base Provider's Orders Podiatry-Dr. Posey Pronto - ***Urgent**** Dr. Posey Pronto at [facility] patient right ankle ORIF on 02/01/2022. Patient now has exposed hardware and tendon. Please get patient in urgently to see Dr. Posey Pronto. Hand Signature: Date(s): Electronic Signature(s) Signed: 04/18/2022 4:35:07 PM By: Dellia Nims,  Legrand Como MD Signed: 04/19/2022 5:40:20 PM By: Deon Pilling RN, BSN Entered By: Deon Pilling on 04/18/2022 10:47:25 -------------------------------------------------------------------------------- Problem List Details Patient Name: Date of Service: Gina Cline, Gina Cline [place] 04/18/2022 9:45 A M Medical Record Number: [id number] Patient Account Number: [id number] Date of Birth/Sex: Treating RN: 01/27/1941 (81 y.o. 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 [name] Primary Care Provider: MA Ok Edwards MES Other Clinician: Referring Provider: Treating Provider/Extender: Williemae Area in Treatment: 0 Subjective Chief Complaint Information obtained from Patient 04/18/22; patient is here for review of 2 small wounds on the right lateral lower leg History of Present Illness (HPI) Admission 04/18/2022 This is a 81 year old healthy woman who had a fall on 02/01/2022 she underwent ORIF of the right ankle bimalleolar fracture surgery by Dr. Posey Pronto of podiatry. The last time that she saw him she says that he use Betadine wet-to-dry. She spent some time at the beach but did not get her leg in water was seen on 04/09/2022 in urgent care with significant erythema of the right ankle lateral aspect. She was put on doxycycline at that point and the erythema seems to have improved. She is not complaining of a lot of pain but she does have drainage from the superior wound. She has not been systemically unwell. 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 She is not a diabeticOr a smoker. ABI in our clinic was 1.18 on the right Patient History Information obtained from Patient, Chart. Allergies penicillin (Reaction: unknown), erythromycin base (Reaction: unknown) Family History Diabetes - Father, Heart Disease - Father. Social History Never smoker, Marital Status - Married, Alcohol Use - Never, Drug Use - No History, Caffeine Use - Never. Medical History Hospitalization/Surgery History - 02/01/2022 ORIF right ankle Dr. Posey Pronto. - Mohs surgery. Medical A Surgical History Notes nd Constitutional Symptoms (General Health) cellulitis to right ankle 04/09/2022 urgent care and  04/13/2022 ED evaluation as well until wound care appt. hypothyroidism Gastrointestinal GERD Oncologic Basal Cell Skin Ca Review of Systems (ROS) Integumentary (Skin) Complains or has symptoms of Wounds - right ankle. Objective Constitutional Sitting or standing Blood Pressure is within target range for patient.. Pulse regular and within target range for patient.Marland Kitchen Respirations regular, non-labored and within target range.. Temperature is normal and within the target range for the patient.Marland Kitchen Appears in no distress. Vitals Time Taken: 9:45 AM, Height: 64 in, Source: Stated, Weight: 115 lbs, Source: Stated, BMI: 19.7, Temperature: 98.1 F, Pulse: 82 bpm, Respiratory Rate: 20 breaths/min, Blood Pressure: 144/81 mmHg. Cardiovascular Pedal pulses are normal. No edema in the right leg. General Notes: Wound exam; the patient has 2 small wounds on  the lateral aspect of the right lower leg/ankle. The most concerning 1 is the superior wound which I believe probes to hardware. This is small but deep. There is no overt purulence. The more distal wound I believe has exposed tendon and I did not debride this either. It is also small but has much less depth. The area of the erythema that was marked after she was in the ER on 9/5 has receded quite a bit certainly not expanded Integumentary (Hair, Skin) Wound #1 status is Open. Original cause of wound was Surgical Injury. The date acquired was: 02/01/2022. The wound is located on the Right,Lateral Ankle. The wound measures 4.2cm length x 0.6cm width x 0.4cm depth; 1.979cm^2 area and 0.792cm^3 volume. There is tendon and Fat Layer (Subcutaneous Tissue) exposed. There is no tunneling or undermining noted. There is a medium amount of serosanguineous drainage noted. The wound margin is distinct with the outline attached to the wound base. There is large (67-100%) red, pink granulation within the wound bed. There is a small (1-33%) amount of necrotic tissue within  the wound bed including Adherent Slough. General Notes: hardware exposed noted to upper portion of wound. 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 There was a Chemical/Enzymatic/Mechanical debridement performed by Deon Pilling, RN.. Other agent used was gauze and wound cleanser. There was no bleeding. The procedure was tolerated well. Post Debridement Measurements: 1.2cm length x 0.6cm width x 0.4cm depth; 0.226cm^3 volume. Character of Wound/Ulcer Post Debridement is stable. Post procedure Diagnosis Wound #1: Same as Pre-Procedure Plan Follow-up Appointments: Return Appointment in 1 week. - Dr. Heber Thurmont and Tammi Klippel, Room 8 Other: - Call Dr. Posey Pronto related to hardware and tendon exposed. Will send the referral today. Call Dr. Posey Pronto office if no one has called you by Monday. Supplies will come to your house from [employer]. Anesthetic: (In clinic) Topical Lidocaine 4% applied to wound bed Bathing/ Shower/ Hygiene: May shower and wash wound with soap and water. Edema Control - Lymphedema / SCD / Other: Avoid standing for long periods of time. Consults ordered were: Podiatry-Dr. Posey Pronto - ***Urgent**** Dr. Posey Pronto at [facility] patient right ankle ORIF on 02/01/2022. Patient now has exposed hardware and tendon. Please get patient in urgently to see Dr. Posey Pronto. Laboratory ordered were: Anaerobic culture - Right ankle wound culture. The following medication(s) was prescribed: lidocaine topical 4 % cream cream topical applied in clinic only. was prescribed at facility WOUND #1: - Ankle Wound Laterality: Right, Lateral Cleanser: Soap and Water Every  Other Day/30 Days Discharge Instructions: May shower and wash wound with dial antibacterial soap and water prior to dressing change. Peri-Wound Care: Skin Prep Every Other Day/30 Days Discharge Instructions: Use skin prep as directed Prim Dressing: Promogran Prisma Matrix, 4.34 (sq in) (silver collagen) (Dispense As Written) Every Other Day/30 Days ary Discharge Instructions: Moisten collagen with [place] Jelly. Purchase [place] Jelly at over the store. Secondary Dressing: Zetuvit Plus Silicone Border Dressing 5x5 (in/in) Every Other Day/30 Days Discharge Instructions: Apply silicone border over primary dressing as directed. 1. Concerning wounds on the right lateral lower leg. Looking back through the photos on her husband's phone shows a fair amount of improvement in the condition of the surrounding  tissue however it was difficult to really identify the wounds. She has no palpable tenderness the degree of erythema here has improved on the doxycycline 2. I did a swab culture of the superior wound which I believe probes to hardware but I did not change her antibiotic orders. She has enough of doxycycline for 2 days we will see what the swab culture results 3. I am referring her back to Dr. Posey Pronto for a surgical opinion. The major concern here would be infected hardware. I would wonder whether it would be possible to remove some of the hardware at this point now 2-1/2 months postsurgery 4. We used silver collagen which we will change every second day as the primary dressing. Electronic Signature(s) Signed: 04/18/2022 4:35:07 PM By: Linton Ham MD Entered By: Linton Ham on 04/18/2022 11:56:46 -------------------------------------------------------------------------------- HxROS Details Patient Name: Date of Service: Gina Cline, Gina Cline [place] 04/18/2022 9:45 A M Medical Record Number: [id number] Patient Account Number: [id number] Date of Birth/Sex: Treating RN: 12-26-1940 (81 y.o. [name] Primary  Care Provider: MA Ok Edwards MES Other Clinician: Referring Provider: Treating Provider/Extender: Williemae Area in Treatment: 0 Information Obtained From Patient Chart Integumentary (Skin) Complaints and Symptoms: Positive for: Wounds - right ankle Constitutional Symptoms (General Health) Medical History: Past Medical History Notes: cellulitis to right ankle 04/09/2022 urgent care and 04/13/2022 ED evaluation as well until wound care appt. hypothyroidism Gastrointestinal Medical History: Past Medical History Notes: GERD Oncologic Medical History: Past Medical History Notes: Basal Cell Skin Ca Immunizations Pneumococcal Vaccine: Received Pneumococcal Vaccination: No Implantable Devices No devices added Hospitalization / Surgery History Type of Hospitalization/Surgery 02/01/2022 ORIF right ankle Dr. Posey Pronto Mohs surgery Family and Social History Diabetes: Yes - Father; Heart Disease: Yes - Father; Never smoker; Marital Status - Married; Alcohol Use: Never; Drug Use: No History; Caffeine Use: Never; Financial Concerns: No; Food, Clothing or Shelter Needs: No; Support System Lacking: No; Transportation Concerns: No Electronic Signature(s) Signed: 04/18/2022 4:35:07 PM By: Linton Ham MD Signed: 04/19/2022 5:40:20 PM By: Deon Pilling RN, BSN Entered By: Deon Pilling on 04/18/2022 09:53:41 -------------------------------------------------------------------------------- SuperBill Details Patient Name: Date of Service: Gina Cline, VASUDEVAN [place] 04/18/2022 Medical Record Number: [id number] Patient Account Number: [id number] Date of Birth/Sex: Treating RN: 14-Oct-1940 (81 y.o. 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 Facility Procedures CPT4 Code: [id number] Description: 99214 - WOUND CARE VISIT-LEV 4 EST PT Modifier: Quantity: 1 CPT4 Code: [id number] Description: 07371 - DEBRIDE W/O ANES NON SELECT Modifier: Quantity: 1 Physician Procedures : CPT4 Code Description Modifier [id number] WC PHYS LEVEL 3 NEW PT ICD-10 Diagnosis Description T81.32XD Disruption of internal operation (surgical) wound, not elsewhere classified, subsequent encounter L03.115 Cellulitis of right lower limb L97.911  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

## 2022-04-19 NOTE — Progress Notes (Signed)
EDRIE EHRICH ([id number]) , Visit Report for 04/18/2022 Allergy List Details Patient Name: Date of Service: Gina Cline, Gina Cline 04/18/2022 9:45 A M Medical Record Number: [id number] Patient Account Number: [id number] Date of Birth/Sex: Treating RN: 09-29-1940 (81 y.o. [name] Primary Care Esaul Dorwart: MA Ok Edwards MES Other Clinician: Referring Darlin Stenseth: Treating Faustine Tates/Extender: Williemae Area in Treatment: 0 Allergies Active Allergies penicillin Reaction: unknown erythromycin base Reaction: unknown Allergy Notes Electronic Signature(s) Signed: 04/19/2022 5:40:20 PM By: Deon Pilling RN, BSN Entered By: Deon Pilling on 04/17/2022 12:37:09 -------------------------------------------------------------------------------- Arrival Information Details Patient Name: Date of Service: Gina Cline, Gina Cline [place] 04/18/2022 9:45 A M Medical Record Number: [id number] Patient Account Number: [id number] Date of Birth/Sex: Treating RN: 1941-06-28 (80 y.o. [name] Primary Care Saramarie Stinger: MA Ok Edwards MES Other Clinician: Referring Alfard Cochrane: Treating Peyson Postema/Extender: Williemae Area in Treatment: 0 Visit Information Patient Arrived: Ambulatory Arrival Time: 09:44 Accompanied By: husband Transfer Assistance: None Patient Identification Verified: Yes Secondary Verification Process Completed: Yes Patient Requires Transmission-Based Precautions: No Patient Has Alerts: No Electronic Signature(s) Signed: 04/19/2022 5:40:20 PM By: Deon Pilling RN, BSN Entered By: Deon Pilling on 04/18/2022 09:49:34 -------------------------------------------------------------------------------- Clinic Level of Care Assessment Details Patient Name: Date of Service: Gina Cline, Gina Cline [place] 04/18/2022 9:45 A M Medical Record Number: [id number] Patient Account Number: [id number] Date of Birth/Sex: Treating RN: 27-Nov-1940 (81 y.o. [name] Primary Care Chadwick Reiswig: MA  Ok Edwards MES Other Clinician: Referring Adithya Difrancesco: Treating Ayomide Purdy/Extender: Williemae Area in Treatment: 0 Clinic Level of Care Assessment Items TOOL 1 Quantity Score X- 1 0 Use when EandM and Procedure is performed on INITIAL visit ASSESSMENTS - Nursing Assessment / Reassessment X- 1 20 General Physical Exam (combine w/ comprehensive assessment (listed just below) when performed on new pt. evals) X- 1 25 Comprehensive Assessment (HX, ROS, Risk Assessments, Wounds Hx, etc.) ASSESSMENTS - Wound and Skin Assessment / Reassessment X- 1 10 Dermatologic / Skin Assessment (not related to wound area) ASSESSMENTS - Ostomy and/or Continence Assessment and Care []  - 0 Incontinence Assessment and Management []  - 0 Ostomy Care Assessment and Management (repouching, etc.) PROCESS - Coordination of Care X - Simple Patient / Family Education for ongoing care 1 15 []  - 0 Complex (extensive) Patient / Family Education for ongoing care X- 1 10 Staff obtains Programmer, systems, Records, T Results / Process Orders est X- 1 10 Staff telephones HHA, Nursing Homes / Clarify orders / etc []  - 0 Routine Transfer to another Facility (non-emergent condition) []  - 0 Routine Hospital Admission (non-emergent condition) X- 1 15 New Admissions / Biomedical engineer / Ordering NPWT Apligraf, etc. , []  - 0 Emergency Hospital Admission (emergent condition) PROCESS - Special Needs []  - 0 Pediatric / Minor Patient Management []  - 0 Isolation Patient Management []  - 0 Hearing / Language / Visual special needs []  - 0 Assessment of Community assistance (transportation, D/C planning, etc.) []  - 0 Additional assistance / Altered mentation []  - 0 Support Surface(s) Assessment (bed, cushion, seat, etc.) INTERVENTIONS - Miscellaneous []  - 0 External ear exam []  - 0 Patient Transfer (multiple staff / Civil Service fast streamer / Similar devices) []  - 0 Simple Staple / Suture removal (25 or less) []  -  0 Complex Staple / Suture removal (26 or more) []  - 0 Hypo/Hyperglycemic Management (do not check if billed separately) X- 1 15 Ankle / Brachial Index (ABI) - do not check if billed separately Has the patient been seen at the hospital within the last three years:  Yes Total Score: 120 Level Of Care: New/Established - Level 4 Electronic Signature(s) Signed: 04/19/2022 5:40:20 PM By: Deon Pilling RN, BSN Signed: 04/19/2022 5:40:20 PM By: Deon Pilling RN, BSN Entered By: Deon Pilling on 04/18/2022 10:46:13 -------------------------------------------------------------------------------- Encounter Discharge Information Details Patient Name: Date of Service: Gina Cline, Gina Cline [place] 04/18/2022 9:45 A M Medical Record Number: [id number] Patient Account Number: [id number] Date of Birth/Sex: Treating RN: [birth year] (81 y.o. [name] Primary Care Wilhemina Grall: MA Ok Edwards MES Other Clinician: Referring Jaquala Fuller: Treating Celvin Taney/Extender: Williemae Area in Treatment: 0 Encounter Discharge Information Items Post Procedure Vitals Discharge Condition: Stable Temperature (F): 98.1 Ambulatory Status: Ambulatory Pulse (bpm): 82 Discharge Destination: Home Respiratory Rate (breaths/min): 20 Transportation: Private Auto Blood Pressure (mmHg): 144/81 Accompanied By: husband Schedule Follow-up Appointment: Yes Clinical Summary of Care: Electronic Signature(s) Signed: 04/19/2022 5:40:20 PM By: Deon Pilling RN, BSN Entered By: Deon Pilling on 04/18/2022 10:47:02 -------------------------------------------------------------------------------- Lower Extremity Assessment Details Patient Name: Date of Service: Gina Cline, Gina Cline [place] 04/18/2022 9:45 A M Medical Record Number: [id number] Patient Account Number: [id number] Date of Birth/Sex: Treating RN: [birth year] (81 y.o. [name], Meta.Reding Primary Care Jasaun Carn: MA Ok Edwards MES Other Clinician: Referring Torsten Weniger: Treating  Kimyah Frein/Extender: Williemae Area in Treatment: 0 Edema Assessment Assessed: [Left: No] [Right: Yes] Edema: [Left: N] [Right: o] Calf Left: Right: Point of Measurement: 34 cm From Medial Instep 27.5 cm Ankle Left: Right: Point of Measurement: 10 cm From Medial Instep 17 cm Knee To Floor Left: Right: From Medial Instep 44 cm Vascular Assessment Pulses: Dorsalis Pedis Palpable: [Right:Yes] Doppler Audible: [Right:Yes] Posterior Tibial Palpable: [Right:Yes] Doppler Audible: [Right:Yes] Blood Pressure: Brachial: [Right:144] Ankle: [Right:Dorsalis Pedis: 170 1.18] Electronic Signature(s) Signed: 04/19/2022 5:40:20 PM By: Deon Pilling RN, BSN Entered By: Deon Pilling on 04/18/2022 10:05:52 -------------------------------------------------------------------------------- Multi Wound Chart Details Patient Name: Date of Service: Gina Cline, Gina Cline [place] 04/18/2022 9:45 A M Medical Record Number: [id number] Patient Account Number: [id number] Date of Birth/Sex: Treating RN: 1941/03/30 (81 y.o. [name] Primary Care Siddhanth Denk: MA Ok Edwards MES Other Clinician: Referring Mechille Varghese: Treating Anyela Napierkowski/Extender: Williemae Area in Treatment: 0 Vital Signs Height(in): 64 Pulse(bpm): 58 Weight(lbs): 115 Blood Pressure(mmHg): 144/81 Body Mass Index(BMI): 19.7 Temperature(F): 98.1 Respiratory Rate(breaths/min): 20 Photos: [N/A:N/A] Right, Lateral Ankle N/A N/A Wound Location: Surgical Injury N/A N/A Wounding Event: Open Surgical Wound N/A N/A Primary Etiology: Dehisced Wound N/A N/A Secondary Etiology: 02/01/2022 N/A N/A Date Acquired: 0 N/A N/A Weeks of Treatment: Open N/A N/A Wound Status: No N/A N/A Wound Recurrence: Yes N/A N/A Clustered Wound: 3 N/A N/A Clustered Quantity: 4.2x0.6x0.4 N/A N/A Measurements L x W x D (cm) 1.979 N/A N/A A (cm) : rea 0.792 N/A N/A Volume (cm) : 0.00% N/A N/A % Reduction in A  rea: 0.00% N/A N/A % Reduction in Volume: Full Thickness With Exposed Support N/A N/A Classification: Structures Medium N/A N/A Exudate Amount: Serosanguineous N/A N/A Exudate Type: red, brown N/A N/A Exudate Color: Distinct, outline attached N/A N/A Wound Margin: Large (67-100%) N/A N/A Granulation Amount: Red, Pink N/A N/A Granulation Quality: Small (1-33%) N/A N/A Necrotic Amount: Fat Layer (Subcutaneous Tissue): Yes N/A N/A Exposed Structures: Tendon: Yes Fascia: No Muscle: No Joint: No Bone: No Medium (34-66%) N/A N/A Epithelialization: Chemical/Enzymatic/Mechanical N/A N/A Debridement: N/A N/A N/A Instrument: None N/A N/A Bleeding: Procedure was tolerated well N/A N/A Debridement Treatment Response: 1.2x0.6x0.4 N/A N/A Post Debridement Measurements L x W x D (cm) 0.226 N/A N/A Post Debridement Volume: (cm) hardware exposed noted to  upper N/A N/A Assessment Notes: portion of wound. Debridement N/A N/A Procedures Performed: Treatment Notes Wound #1 (Ankle) Wound Laterality: Right, Lateral Cleanser Wound Cleanser Discharge Instruction: Cleanse the wound with wound cleanser prior to applying a clean dressing using gauze sponges, not tissue or cotton balls. Peri-Wound Care Skin Prep Discharge Instruction: Use skin prep as directed Topical Primary Dressing Promogran Prisma Matrix, 4.34 (sq in) (silver collagen) Discharge Instruction: Moisten collagen with [place]. Purchase [place] Jelly at over the store. Secondary Dressing Zetuvit Plus Silicone Border Dressing 5x5 (in/in) Discharge Instruction: Apply silicone border over primary dressing as directed. Secured With Compression Wrap Compression Stockings Environmental education officer) Signed: 04/18/2022 4:35:07 PM By: Linton Ham MD Signed: 04/19/2022 5:40:20 PM By: Deon Pilling RN, BSN Entered By: Linton Ham on 04/18/2022  11:43:43 -------------------------------------------------------------------------------- Multi-Disciplinary Care Plan Details Patient Name: Date of Service: Gina Cline, Gina Cline [place] 04/18/2022 9:45 A M Medical Record Number: [id number] Patient Account Number: [id number] Date of Birth/Sex: Treating RN: [birth year] (81 y.o. [name], Meta.Reding Primary Care Markeesha Char: MA Ok Edwards MES Other Clinician: Referring Brayon Bielefeld: Treating Jayzen Paver/Extender: Williemae Area in Treatment: 0 Active Inactive Necrotic Tissue Nursing Diagnoses: Impaired tissue integrity related to necrotic/devitalized tissue Goals: Necrotic/devitalized tissue will be minimized in the wound bed Date Initiated: 04/18/2022 Target Resolution Date: 05/10/2022 Goal Status: Active Patient/caregiver will verbalize understanding of reason and process for debridement of necrotic tissue Date Initiated: 04/18/2022 Target Resolution Date: 05/10/2022 Goal Status: Active Interventions: Assess patient pain level pre-, during and post procedure and prior to discharge Provide education on necrotic tissue and debridement process Treatment Activities: Apply topical anesthetic as ordered : 04/18/2022 T ordered outside of clinic : 04/18/2022 est Notes: Orientation to the Wound Care Program Nursing Diagnoses: Knowledge deficit related to the wound healing center program Goals: Patient/caregiver will verbalize understanding of the [facility] Date Initiated: 04/18/2022 Target Resolution Date: 05/08/2022 Goal Status: Active Interventions: Provide education on orientation to the wound center Notes: Pain, Acute or Chronic Nursing Diagnoses: Pain, acute or chronic: actual or potential Potential alteration in comfort, pain Goals: Patient will verbalize adequate pain control and receive pain control interventions during procedures as needed Date Initiated: 04/18/2022 Target Resolution Date: 05/10/2022 Goal Status:  Active Patient/caregiver will verbalize comfort level met Date Initiated: 04/18/2022 Target Resolution Date: 05/10/2022 Goal Status: Active Interventions: Encourage patient to take pain medications as prescribed Provide education on pain management Reposition patient for comfort Treatment Activities: Administer pain control measures as ordered : 04/18/2022 Notes: Wound/Skin Impairment Nursing Diagnoses: Knowledge deficit related to ulceration/compromised skin integrity Goals: Patient/caregiver will verbalize understanding of skin care regimen Date Initiated: 04/18/2022 Target Resolution Date: 05/10/2022 Goal Status: Active Interventions: Assess patient/caregiver ability to perform ulcer/skin care regimen upon admission and as needed Assess ulceration(s) every visit Provide education on ulcer and skin care Treatment Activities: Skin care regimen initiated : 04/18/2022 Topical wound management initiated : 04/18/2022 Notes: Electronic Signature(s) Signed: 04/19/2022 5:40:20 PM By: Deon Pilling RN, BSN Entered By: Deon Pilling on 04/18/2022 10:12:17 -------------------------------------------------------------------------------- Pain Assessment Details Patient Name: Date of Service: Gina Cline, Gina Cline [place] 04/18/2022 9:45 A M Medical Record Number: [id number] Patient Account Number: [id number] Date of Birth/Sex: Treating RN: 1940-10-05 (81 y.o. [name] Primary Care Johnni Wunschel: MA Ok Edwards MES Other Clinician: Referring Desare Duddy: Treating Darianny Momon/Extender: Williemae Area in Treatment: 0 Active Problems Location of Pain Severity and Description of Pain Patient Has Paino No Site Locations Rate the pain. Current Pain Level: 0 Pain Management and Medication Current Pain  Management: Medication: No Cold Application: No Rest: No Massage: No Activity: No T.E.N.S.: No Heat Application: No Leg drop or elevation: No Is the Current Pain Management Adequate:  Adequate How does your wound impact your activities of daily livingo Sleep: No Bathing: No Appetite: No Relationship With Others: No Bladder Continence: No Emotions: No Bowel Continence: No Work: No Toileting: No Drive: No Dressing: No Hobbies: No Notes per patient discomfort with the wound area. Electronic Signature(s) Signed: 04/19/2022 5:40:20 PM By: Deon Pilling RN, BSN Entered By: Deon Pilling on 04/18/2022 09:51:05 -------------------------------------------------------------------------------- Patient/Caregiver Education Details Patient Name: Date of Service: Randalyn Rhea Gina Cline 9/14/2023andnbsp9:45 A M Medical Record Number: [id number] Patient Account Number: [id number] Date of Birth/Gender: Treating RN: 1940-08-28 (81 y.o. [name] Primary Care Physician: MA Ok Edwards MES Other Clinician: Referring Physician: Treating Physician/Extender: Williemae Area in Treatment: 0 Education Assessment Education Provided To: Patient Education Topics Provided [place]: o Handouts: Welcome T [facility] [place] o Methods: Explain/Verbal Responses: Reinforcements needed Electronic Signature(s) Signed: 04/19/2022 5:40:20 PM By: Deon Pilling RN, BSN Entered By: Deon Pilling on 04/18/2022 10:12:30 -------------------------------------------------------------------------------- Wound Assessment Details Patient Name: Date of Service: Gina Cline, Gina Cline [place] 04/18/2022 9:45 A M Medical Record Number: [id number] Patient Account Number: [id number] Date of Birth/Sex: Treating RN: Jul 28, 1941 (81 y.o. [name], Meta.Reding Primary Care Mariafernanda Hendricksen: MA Ok Edwards MES Other Clinician: Referring Shaquoya Cosper: Treating Saretta Dahlem/Extender: Williemae Area in Treatment: 0 Wound Status Wound Number: 1 Primary Etiology: Open Surgical Wound Wound Location: Right, Lateral Ankle Secondary Etiology: Dehisced Wound Wounding Event:  Surgical Injury Wound Status: Open Date Acquired: 02/01/2022 Weeks Of Treatment: 0 Clustered Wound: Yes Photos Wound Measurements Length: (cm) 4.2 Width: (cm) 0.6 Depth: (cm) 0.4 Clustered Quantity: 3 Area: (cm) 1.97 Volume: (cm) 0.79 % Reduction in Area: 0% % Reduction in Volume: 0% Epithelialization: Medium (34-66%) Tunneling: No 9 Undermining: No 2 Wound Description Classification: Full Thickness With Exposed Support Structu Wound Margin: Distinct, outline attached Exudate Amount: Medium Exudate Type: Serosanguineous Exudate Color: red, brown res Foul Odor After Cleansing: No Slough/Fibrino Yes Wound Bed Granulation Amount: Large (67-100%) Exposed Structure Granulation Quality: Red, Pink Fascia Exposed: No Necrotic Amount: Small (1-33%) Fat Layer (Subcutaneous Tissue) Exposed: Yes Necrotic Quality: Adherent Slough Tendon Exposed: Yes Muscle Exposed: No Joint Exposed: No Bone Exposed: No Assessment Notes hardware exposed noted to upper portion of wound. Treatment Notes Wound #1 (Ankle) Wound Laterality: Right, Lateral Cleanser Wound Cleanser Discharge Instruction: Cleanse the wound with wound cleanser prior to applying a clean dressing using gauze sponges, not tissue or cotton balls. Peri-Wound Care Skin Prep Discharge Instruction: Use skin prep as directed Topical Primary Dressing Promogran Prisma Matrix, 4.34 (sq in) (silver collagen) Discharge Instruction: Moisten collagen with [place]. Purchase [place] Jelly at over the store. Secondary Dressing Zetuvit Plus Silicone Border Dressing 5x5 (in/in) Discharge Instruction: Apply silicone border over primary dressing as directed. Secured With Compression Wrap Compression Stockings Environmental education officer) Signed: 04/19/2022 5:40:20 PM By: Deon Pilling RN, BSN Entered By: Deon Pilling on 04/18/2022 10:37:16 -------------------------------------------------------------------------------- Vitals  Details Patient Name: Date of Service: Gina Cline, Gina Cline [place] 04/18/2022 9:45 A M Medical Record Number: [id number] Patient Account Number: [id number] Date of Birth/Sex: Treating RN: June 05, 1941 (81 y.o. [name] Primary Care Cathaleen Korol: MA Ok Edwards MES Other Clinician: Referring Avari Nevares: Treating Umar Patmon/Extender: Williemae Area in Treatment: 0 Vital Signs Time Taken: 09:45 Temperature (F): 98.1 Height (in): 64 Pulse (bpm): 82 Source: Stated Respiratory Rate (  breaths/min): 20 Weight (lbs): 115 Blood Pressure (mmHg): 144/81 Source: Stated Reference Range: 80 - 120 mg / dl Body Mass Index (BMI): 19.7 Electronic Signature(s) Signed: 04/19/2022 5:40:20 PM By: Deon Pilling RN, BSN Entered By: Deon Pilling on 04/18/2022 09:50:04

## 2022-04-19 NOTE — Progress Notes (Signed)
Gina Cline ([id number]) , Visit Report for 04/18/2022 Abuse Risk Screen Details Patient Name: Date of Service: Gina Cline, Gina Cline 04/18/2022 9:45 A M Medical Record Number: [id number] Patient Account Number: [id number] Date of Birth/Sex: Treating RN: 1941/05/25 (81 y.o. [name] Primary Care Ashyra Cantin: MA Ok Edwards MES Other Clinician: Referring Jeneva Schweizer: Treating Jayce Boyko/Extender: Williemae Area in Treatment: 0 Abuse Risk Screen Items Answer ABUSE RISK SCREEN: Has anyone close to you tried to hurt or harm you recentlyo No Do you feel uncomfortable with anyone in your familyo No Has anyone forced you do things that you didnt want to doo No Electronic Signature(s) Signed: 04/19/2022 5:40:20 PM By: Deon Pilling RN, BSN Entered By: Deon Pilling on 04/18/2022 09:50:16 -------------------------------------------------------------------------------- Activities of Daily Living Details Patient Name: Date of Service: Gina Cline, Gina Cline 04/18/2022 9:45 A M Medical Record Number: [id number] Patient Account Number: [id number] Date of Birth/Sex: Treating RN: 12/08/1940 (81 y.o. [name] Primary Care Conway Fedora: MA Ok Edwards MES Other Clinician: Referring Aviella Disbrow: Treating Payton Moder/Extender: Williemae Area in Treatment: 0 Activities of Daily Living Items Answer Activities of Daily Living (Please select one for each item) Drive Automobile Completely Able T Medications ake Completely Able Use T elephone Completely Able Care for Appearance Completely Able Use T oilet Completely Able Bath / Shower Completely Able Dress Self Completely Able Feed Self Completely Able Walk Completely Able Get In / Out Bed Completely Able Housework Completely Able Prepare Meals Completely [place] for Self Completely Able Electronic Signature(s) Signed: 04/19/2022 5:40:20 PM By: Deon Pilling RN, BSN Entered By: Deon Pilling on 04/18/2022 09:50:30 -------------------------------------------------------------------------------- Education Screening Details Patient Name: Date of Service: Gina Cline, Gina Cline [place] 04/18/2022 9:45 A M Medical Record Number: [id number] Patient Account Number: [id number] Date of Birth/Sex: Treating RN: May 16, 1941 (81 y.o. [name] Primary Care Ayrabella Labombard: MA Ok Edwards MES Other Clinician: Referring Leveta Wahab: Treating Toivo Bordon/Extender: Williemae Area in Treatment: 0 Primary Learner Assessed: Patient Learning Preferences/Education Level/Primary Language Learning Preference: Explanation, Demonstration, Printed Material Highest Education Level: College or Above Preferred Language: English Cognitive Barrier Language Barrier: No Translator Needed: No Memory Deficit: No Emotional Barrier: No Cultural/Religious Beliefs Affecting Medical Care: No Physical Barrier Impaired Vision: No Impaired Hearing: No Decreased Hand dexterity: No Knowledge/Comprehension Knowledge Level: High Comprehension Level: High Ability to understand written instructions: High Ability to understand verbal instructions: High Motivation Anxiety Level: Calm Cooperation: Cooperative Education Importance: Acknowledges Need Interest in Health Problems: Asks Questions Perception: Coherent Willingness to Engage in Self-Management High Activities: Readiness to Engage in Self-Management High Activities: Electronic Signature(s) Signed: 04/19/2022 5:40:20 PM By: Deon Pilling RN, BSN Entered By: Deon Pilling on 04/18/2022 09:50:45 -------------------------------------------------------------------------------- Fall Risk Assessment Details Patient Name: Date of Service: Gina Cline, Gina Cline [place] 04/18/2022 9:45 A M Medical Record Number: [id number] Patient Account Number: [id number] Date of Birth/Sex: Treating RN: October 07, 1940 (81 y.o. [name], Meta.Reding Primary Care Shariq Puig: MA Ok Edwards  MES Other Clinician: Referring Kamir Selover: Treating Tanashia Ciesla/Extender: Williemae Area in Treatment: 0 Fall Risk Assessment Items Have you had 2 or more falls in the last 12 monthso 0 No Have you had any fall that resulted in injury in the last 12 monthso 0 Yes FALLS RISK SCREEN History of falling - immediate or within 3 months 25 Yes Secondary diagnosis (Do you have 2 or more medical diagnoseso) 0 No Ambulatory aid None/bed rest/wheelchair/nurse 0 Yes Crutches/cane/walker 0 No Furniture 0 No Intravenous therapy Access/Saline/Heparin Lock 0 No Gait/Transferring Normal/  bed rest/ wheelchair 0 Yes Weak (short steps with or without shuffle, stooped but able to lift head while walking, may seek 0 No support from furniture) Impaired (short steps with shuffle, may have difficulty arising from chair, head down, impaired 0 No balance) Mental Status Oriented to own ability 0 Yes Electronic Signature(s) Signed: 04/19/2022 5:40:20 PM By: Deon Pilling RN, BSN Entered By: Deon Pilling on 04/18/2022 09:51:28 -------------------------------------------------------------------------------- Foot Assessment Details Patient Name: Date of Service: Gina Cline, Gina Cline [place] 04/18/2022 9:45 A M Medical Record Number: [id number] Patient Account Number: [id number] Date of Birth/Sex: Treating RN: 09-07-1940 (81 y.o. [name] Primary Care Matheu Ploeger: MA Ok Edwards MES Other Clinician: Referring Ashwika Freels: Treating Cynthie Garmon/Extender: Williemae Area in Treatment: 0 Foot Assessment Items Site Locations + = Sensation present, - = Sensation absent, C = Callus, U = Ulcer R = Redness, W = Warmth, M = Maceration, PU = Pre-ulcerative lesion F = Fissure, S = Swelling, D = Dryness Assessment Right: Left: Other Deformity: No No Prior Foot Ulcer: No No Prior Amputation: No No Charcot Joint: No No Ambulatory Status: Ambulatory Without Help Gait: Steady Electronic  Signature(s) Signed: 04/19/2022 5:40:20 PM By: Deon Pilling RN, BSN Entered By: Deon Pilling on 04/18/2022 10:04:52 -------------------------------------------------------------------------------- Nutrition Risk Screening Details Patient Name: Date of Service: Gina Cline, Gina Cline [employer] 04/18/2022 9:45 A M Medical Record Number: [id number] Patient Account Number: [id number] Date of Birth/Sex: Treating RN: Feb 03, 1941 (81 y.o. [name], Meta.Reding Primary Care Jerzi Tigert: MA Ok Edwards MES Other Clinician: Referring Arlita Buffkin: Treating Philamena Kramar/Extender: Williemae Area in Treatment: 0 Height (in): 64 Weight (lbs): 115 Body Mass Index (BMI): 19.7 Nutrition Risk Screening Items Score Screening NUTRITION RISK SCREEN: I have an illness or condition that made me change the kind and/or amount of food I eat 0 No I eat fewer than two meals per day 0 No I eat few fruits and vegetables, or milk products 0 No I have three or more drinks of beer, liquor or wine almost every day 0 No I have tooth or mouth problems that make it hard for me to eat 0 No I don't always have enough money to buy the food I need 0 No I eat alone most of the time 0 No I take three or more different prescribed or over-the-counter drugs a day 0 No Without wanting to, I have lost or gained 10 pounds in the last six months 0 No I am not always physically able to shop, cook and/or feed myself 0 No Nutrition Protocols Good Risk Protocol 0 No interventions needed Moderate Risk Protocol High Risk Proctocol Risk Level: Good Risk Score: 0 Electronic Signature(s) Signed: 04/19/2022 5:40:20 PM By: Deon Pilling RN, BSN Entered By: Deon Pilling on 04/18/2022 09:52:24

## 2022-04-23 LAB — AEROBIC/ANAEROBIC CULTURE W GRAM STAIN (SURGICAL/DEEP WOUND)

## 2022-04-24 ENCOUNTER — Ambulatory Visit (INDEPENDENT_AMBULATORY_CARE_PROVIDER_SITE_OTHER): Payer: Medicare HMO

## 2022-04-24 ENCOUNTER — Ambulatory Visit (INDEPENDENT_AMBULATORY_CARE_PROVIDER_SITE_OTHER): Payer: Medicare HMO | Admitting: Podiatry

## 2022-04-24 DIAGNOSIS — S82891A Other fracture of right lower leg, initial encounter for closed fracture: Secondary | ICD-10-CM

## 2022-04-24 DIAGNOSIS — L97311 Non-pressure chronic ulcer of right ankle limited to breakdown of skin: Secondary | ICD-10-CM

## 2022-04-24 DIAGNOSIS — Z9889 Other specified postprocedural states: Secondary | ICD-10-CM

## 2022-04-24 NOTE — Progress Notes (Signed)
Subjective:  Patient ID: Gina Cline, female    DOB: [birth year],  MRN: [id number]  Chief Complaint  Patient presents with   Routine Post Op    DOS: 02/01/2022 Procedure: Right ankle fracture ORIF  81 y.o. female returns for post-op check.  Patient is she is admitted physical therapy.  She is being followed at the wound care center.  She has superficial dehiscence as well.  She denies any other acute complaints Review of Systems: Negative except as noted in the HPI. Denies N/V/F/Ch.  Past Medical History:  Diagnosis Date   Cancer ([place])    basal cell skin cancer   GERD (gastroesophageal reflux disease)    Hypothyroidism    h/o no meds   Palpitations    very rarely when she lies down at night    Current Outpatient Medications:    Calcium Carb-Cholecalciferol (CALCIUM 500+D3 PO), Take 1 tablet by mouth daily at 6 (six) AM., Disp: , Rfl:    Cholecalciferol (VITAMIN D-1000 MAX ST) 25 MCG (1000 UT) tablet, Take 1,000 Units by mouth daily., Disp: , Rfl:    Ferrous Sulfate (IRON PO), Take 1 tablet by mouth 3 (three) times a week., Disp: , Rfl:    HYDROcodone-acetaminophen (NORCO) 5-325 MG tablet, Take 1 tablet by mouth every 6 (six) hours as needed for moderate pain., Disp: 30 tablet, Rfl: 0   HYDROcodone-acetaminophen (NORCO/VICODIN) 5-325 MG tablet, Take 1 tablet by mouth daily as needed for moderate pain., Disp: 21 tablet, Rfl: 0   HYDROcodone-acetaminophen (NORCO/VICODIN) 5-325 MG tablet, Take 1 tablet by mouth daily as needed for moderate pain., Disp: 30 tablet, Rfl: 0   HYDROcodone-acetaminophen (NORCO/VICODIN) 5-325 MG tablet, Take 2 tablets by mouth 2 (two) times daily as needed., Disp: 30 tablet, Rfl: 0   ibuprofen (ADVIL) 800 MG tablet, Take 1 tablet (800 mg total) by mouth every 6 (six) hours as needed., Disp: 60 tablet, Rfl: 1   omeprazole (PRILOSEC) 40 MG capsule, Take 40 mg by mouth every morning., Disp: , Rfl:    oxyCODONE-acetaminophen (PERCOCET) 5-325 MG tablet, Take 1  tablet by mouth every 4 (four) hours as needed for severe pain., Disp: 30 tablet, Rfl: 0  Social History   Tobacco Use  Smoking Status Never  Smokeless Tobacco Never    Allergies  Allergen Reactions   Penicillins Hives    Other reaction(s): Other (See Comments) unknown    Erythromycin     Unsure of reaction   Objective:  There were no vitals filed for this visit. There is no height or weight on file to calculate BMI. Constitutional Well developed. Well nourished.  Vascular Foot warm and well perfused. Capillary refill normal to all digits.   Neurologic Normal speech. Oriented to person, place, and time. Epicritic sensation to light touch grossly present bilaterally.  Dermatologic Superficial dehiscence likely due to either the boot rubbing against the incision site or hardware pressing against the skin.  No clinical signs of infection noted.  No exposure of hardware noted.  Good range of motion noted at the ankle joint 10 degrees past neutral.  Orthopedic: Tenderness to palpation noted about the surgical site.   Radiographs: 3 views of skeletally mature adult right foot: Hardware is intact no signs of loosening or backing out noted.  Good right reduction of ankle mortise noted.  Fracture sites are consolidating no gapping of the syndesmosis noted. Assessment:   1. Closed fracture of right ankle, initial encounter   2. Skin ulcer of right ankle, limited to breakdown  of skin ([place])   3. Status post foot surgery      Plan:  Patient was evaluated and treated and all questions answered.  S/p foot surgery right -Clinically there is still some superficial areas of dehiscence noted.  This is being managed at the wound care center primarily.  The hardware is intact no signs of backing out or loosening noted.  If there is no improvement we will discuss hardware removal likely this is due to the thickness of the hardware pressing against the fragile skin.  I discussed with patient she  states understanding. -We will plan on getting a CT scan during next clinical visit if there is no improvement -Continue primary management of the wound care center  No follow-ups on file.

## 2022-04-25 ENCOUNTER — Encounter (HOSPITAL_BASED_OUTPATIENT_CLINIC_OR_DEPARTMENT_OTHER): Payer: Medicare HMO | Admitting: Internal Medicine

## 2022-04-25 DIAGNOSIS — L03115 Cellulitis of right lower limb: Secondary | ICD-10-CM | POA: Diagnosis not present

## 2022-04-25 DIAGNOSIS — L97918 Non-pressure chronic ulcer of unspecified part of right lower leg with other specified severity: Secondary | ICD-10-CM | POA: Diagnosis not present

## 2022-04-25 DIAGNOSIS — Z96698 Presence of other orthopedic joint implants: Secondary | ICD-10-CM

## 2022-04-25 DIAGNOSIS — L97811 Non-pressure chronic ulcer of other part of right lower leg limited to breakdown of skin: Secondary | ICD-10-CM | POA: Diagnosis not present

## 2022-04-25 DIAGNOSIS — T8132XD Disruption of internal operation (surgical) wound, not elsewhere classified, subsequent encounter: Secondary | ICD-10-CM

## 2022-04-26 ENCOUNTER — Other Ambulatory Visit (HOSPITAL_COMMUNITY): Payer: Self-pay | Admitting: Internal Medicine

## 2022-04-26 ENCOUNTER — Other Ambulatory Visit: Payer: Self-pay | Admitting: Internal Medicine

## 2022-04-26 DIAGNOSIS — T8189XA Other complications of procedures, not elsewhere classified, initial encounter: Secondary | ICD-10-CM

## 2022-04-26 NOTE — Progress Notes (Signed)
Gina Cline ([id number]) , Visit Report for 04/25/2022 Arrival Information Details Patient Name: Date of Service: Gina, Cline 04/25/2022 3:00 PM Medical Record Number: [id number] Patient Account Number: [id number] Date of Birth/Sex: Treating RN: August 16, 1940 (81 y.o. Gina Cline, Meta.Reding Primary Care Brandy Kabat: MA Ok Edwards MES Other Clinician: Referring Ranell Finelli: Treating Yeila Morro/Extender: Kalman Shan MA NNING, JA MES Weeks in Treatment: 1 Visit Information History Since Last Visit All ordered tests and consults were completed: Yes Patient Arrived: Ambulatory Added or deleted any medications: No Arrival Time: 15:06 Any new allergies or adverse reactions: No Accompanied By: Husband Had a fall or experienced change in No Transfer Assistance: None activities of daily living that may affect Patient Identification Verified: Yes risk of falls: Secondary Verification Process Completed: Yes Signs or symptoms of abuse/neglect since last visito No Patient Requires Transmission-Based Precautions: No Hospitalized since last visit: No Patient Has Alerts: No Implantable device outside of the clinic excluding No cellular tissue based products placed in the center since last visit: Has Dressing in Place as Prescribed: Yes Pain Present Now: No Notes per patient seen Dr. Posey Pronto yesterday says to follow wound care. Electronic Signature(s) Signed: 04/25/2022 5:58:31 PM By: Deon Pilling RN, BSN Entered By: Deon Pilling on 04/25/2022 15:06:56 -------------------------------------------------------------------------------- Clinic Level of Care Assessment Details Patient Name: Date of Service: Gina Cline, RANIERI 04/25/2022 3:00 PM Medical Record Number: [id number] Patient Account Number: [id number] Date of Birth/Sex: Treating RN: 04-06-1941 (81 y.o. Gina Cline, Meta.Reding Primary Care Pellegrino Kennard: MA Ok Edwards MES Other Clinician: Referring Raquel Racey: Treating Daeton Kluth/Extender: Kalman Shan MA  NNING, JA MES Weeks in Treatment: 1 Clinic Level of Care Assessment Items TOOL 4 Quantity Score X- 1 0 Use when only an EandM is performed on FOLLOW-UP visit ASSESSMENTS - Nursing Assessment / Reassessment X- 1 10 Reassessment of Co-morbidities (includes updates in patient status) X- 1 5 Reassessment of Adherence to Treatment Plan ASSESSMENTS - Wound and Skin A ssessment / Reassessment X - Simple Wound Assessment / Reassessment - one wound 1 5 _0  - 0 Complex Wound Assessment / Reassessment - multiple wounds X- 1 10 Dermatologic / Skin Assessment (not related to wound area) ASSESSMENTS - Focused Assessment X- 1 5 Circumferential Edema Measurements - multi extremities _1  - 0 Nutritional Assessment / Counseling / Intervention _2  - 0 Lower Extremity Assessment (monofilament, tuning fork, pulses) _3  - 0 Peripheral Arterial Disease Assessment (using hand held doppler) ASSESSMENTS - Ostomy and/or Continence Assessment and Care _4  - 0 Incontinence Assessment and Management _5  - 0 Ostomy Care Assessment and Management (repouching, etc.) PROCESS - Coordination of Care X - Simple Patient / Family Education for ongoing care 1 15 _6  - 0 Complex (extensive) Patient / Family Education for ongoing care X- 1 10 Staff obtains Programmer, systems, Records, T Results / Process Orders est _7  - 0 Staff telephones HHA, Nursing Homes / Clarify orders / etc _8  - 0 Routine Transfer to another Facility (non-emergent condition) _9  - 0 Routine Hospital Admission (non-emergent condition) _10  - 0 New Admissions / Biomedical engineer / Ordering NPWT Apligraf, etc. , _11  - 0 Emergency Hospital Admission (emergent condition) X- 1 10 Simple Discharge Coordination _12  - 0 Complex (extensive) Discharge Coordination PROCESS - Special Needs _13  - 0 Pediatric / Minor Patient Management _14  - 0 Isolation Patient Management _15  - 0 Hearing / Language / Visual special needs _16  - 0 Assessment of Community  assistance (transportation, D/C planning, etc.) _17  - 0 Additional assistance / Altered mentation _18  - 0 Support Surface(s) Assessment (bed, cushion, seat,  etc.) INTERVENTIONS - Wound Cleansing / Measurement X - Simple Wound Cleansing - one wound 1 5 _0  - 0 Complex Wound Cleansing - multiple wounds X- 1 5 Wound Imaging (photographs - any number of wounds) _1  - 0 Wound Tracing (instead of photographs) X- 1 5 Simple Wound Measurement - one wound _2  - 0 Complex Wound Measurement - multiple wounds INTERVENTIONS - Wound Dressings X - Small Wound Dressing one or multiple wounds 1 10 _3  - 0 Medium Wound Dressing one or multiple wounds _4  - 0 Large Wound Dressing one or multiple wounds <[id number]>_9<\/[id number]>_3  - 0 Application of Medications - topical <[id number]>_5<\/WLSLHTDSKAJGOTLX>_7  - 0 Application of Medications - injection INTERVENTIONS - Miscellaneous _7  - 0 External ear exam _8  - 0 Specimen Collection (cultures, biopsies, blood, body fluids, etc.) _9  - 0 Specimen(s) / Culture(s) sent or taken to Lab for analysis _10  - 0 Patient Transfer (multiple staff / Civil Service fast streamer / Similar devices) _11  - 0 Simple Staple / Suture removal (25 or less) _12  - 0 Complex Staple / Suture removal (26 or more) _13  - 0 Hypo / Hyperglycemic Management (close monitor of Blood Glucose) _14  - 0 Ankle / Brachial Index (ABI) - do not check if billed separately X- 1 5 Vital Signs Has the patient been seen at the hospital within the last three years: Yes Total Score: 100 Level Of Care: New/Established - Level 3 Electronic Signature(s) Signed: 04/25/2022 5:58:31 PM By: Deon Pilling RN, BSN Entered By: Deon Pilling on 04/25/2022 16:03:05 -------------------------------------------------------------------------------- Encounter Discharge Information Details Patient Name: Date of Service: Gina Cline, ELDREDGE [place] 04/25/2022 3:00 PM Medical Record Number: [id number] Patient Account Number: [id number] Date of Birth/Sex: Treating RN: [birth year] (81 y.o. [name] Primary Care Sherree Shankman: MA Ok Edwards MES Other Clinician: Referring Saed Hudlow: Treating Rajanee Schuelke/Extender: Kalman Shan MA NNING, JA MES Weeks in Treatment: 1 Encounter Discharge Information Items Discharge Condition: Stable Ambulatory Status: Ambulatory Discharge Destination: Home Transportation: Private Auto Accompanied By: husband Schedule Follow-up Appointment: Yes Clinical Summary of Care: Electronic Signature(s) Signed: 04/25/2022 5:58:31 PM By: Deon Pilling RN, BSN Entered By: Deon Pilling on 04/25/2022 16:03:30 -------------------------------------------------------------------------------- Lower Extremity Assessment Details Patient Name: Date of Service: Gina Cline, BRIERE [place] 04/25/2022 3:00 PM Medical Record Number: [id number] Patient Account Number: [id number] Date of Birth/Sex: Treating RN: 12-Apr-1941 (81 y.o. [name] Primary Care Traeh Milroy: MA Ok Edwards MES Other Clinician: Referring Dayshaun Whobrey: Treating Eleny Cortez/Extender: Kalman Shan MA NNING, JA MES Weeks in Treatment: 1 Edema Assessment Assessed: [Left: No] [Right: Yes] Edema: [Left: N] [Right: o] Calf Left: Right: Point of Measurement: 34 cm From Medial Instep 28.5 cm Ankle Left: Right: Point of Measurement: 10 cm From Medial Instep 17 cm Vascular Assessment Pulses: Dorsalis Pedis Palpable: [Right:Yes] Electronic Signature(s) Signed: 04/25/2022 5:58:31 PM By: Deon Pilling RN, BSN Entered By: Deon Pilling on 04/25/2022 15:08:08 -------------------------------------------------------------------------------- Multi Wound Chart Details Patient Name: Date of Service: Gina Cline, RAICHE [place] 04/25/2022 3:00 PM Medical Record Number: [id number] Patient Account Number: [id number] Date of Birth/Sex: Treating RN: 07/20/1941 (81 y.o. [name] Primary Care Marinell Igarashi: MA Ok Edwards MES Other Clinician: Referring Tramaine Sauls: Treating Rosabelle Jupin/Extender: Kalman Shan MA NNING, JA MES Weeks in  Treatment: 1 Vital Signs Height(in): 64 Pulse(bpm): 80 Weight(lbs): 115 Blood Pressure(mmHg): 151/75 Body Mass Index(BMI): 19.7 Temperature(F): 99 Respiratory Rate(breaths/min): 20 Photos: [N/A:N/A] Right, Lateral Ankle N/A N/A Wound Location: Surgical Injury N/A N/A Wounding Event: Open Surgical Wound N/A N/A Primary Etiology: Dehisced Wound N/A N/A Secondary Etiology: 02/01/2022 N/A N/A Date Acquired: 1 N/A N/A Weeks of Treatment: Open  N/A N/A Wound Status: No N/A N/A Wound Recurrence: Yes N/A N/A Clustered Wound: 3 N/A N/A Clustered Quantity: 7.4x0.4x0.4 N/A N/A Measurements L x W x D (cm) 2.325 N/A N/A A (cm) : rea 0.93 N/A N/A Volume (cm) : -17.50% N/A N/A % Reduction in A rea: -17.40% N/A N/A % Reduction in Volume: Full Thickness With Exposed Support N/A N/A Classification: Structures Medium N/A N/A Exudate Amount: Serosanguineous N/A N/A Exudate Type: red, brown N/A N/A Exudate Color: Distinct, outline attached N/A N/A Wound Margin: Medium (34-66%) N/A N/A Granulation Amount: Red, Pink N/A N/A Granulation Quality: Medium (34-66%) N/A N/A Necrotic Amount: Fat Layer (Subcutaneous Tissue): Yes N/A N/A Exposed Structures: Fascia: No Tendon: No Muscle: No Joint: No Bone: No Medium (34-66%) N/A N/A Epithelialization: hardware exposed to two of the three N/A N/A Assessment Notes: areas. Treatment Notes Wound #1 (Ankle) Wound Laterality: Right, Lateral Cleanser VASHE Discharge Instruction: ****VASHE to purchase off [place].*** Peri-Wound Care Skin Prep Discharge Instruction: Use skin prep as directed Topical Gentamicin Discharge Instruction: apply directly to wound bed. Primary Dressing Secondary Dressing Zetuvit Plus Silicone Border Dressing 7x7(in/in) Discharge Instruction: Apply silicone border over primary dressing as directed. Secured With Compression Wrap Compression Stockings Environmental education officer) Signed:  04/25/2022 5:58:31 PM By: Deon Pilling RN, BSN Signed: 04/26/2022 9:20:47 AM By: Kalman Shan DO Entered By: Kalman Shan on 04/25/2022 16:16:59 -------------------------------------------------------------------------------- Multi-Disciplinary Care Plan Details Patient Name: Date of Service: Gina Cline, STAUBER [place] 04/25/2022 3:00 PM Medical Record Number: [id number] Patient Account Number: [id number] Date of Birth/Sex: Treating RN: 1940/08/15 (81 y.o. Gina Cline, Meta.Reding Primary Care Rasheem Figiel: MA Ok Edwards MES Other Clinician: Referring [place]: Treating Bronte Sabado/Extender: Kalman Shan MA NNING, JA MES Weeks in Treatment: 1 Active Inactive Necrotic Tissue Nursing Diagnoses: Impaired tissue integrity related to necrotic/devitalized tissue Goals: Necrotic/devitalized tissue will be minimized in the wound bed Date Initiated: 04/18/2022 Target Resolution Date: 05/10/2022 Goal Status: Active Patient/caregiver will verbalize understanding of reason and process for debridement of necrotic tissue Date Initiated: 04/18/2022 Target Resolution Date: 05/10/2022 Goal Status: Active Interventions: Assess patient pain level pre-, during and post procedure and prior to discharge Provide education on necrotic tissue and debridement process Treatment Activities: Apply topical anesthetic as ordered : 04/18/2022 T ordered outside of clinic : 04/18/2022 est Notes: Pain, Acute or Chronic Nursing Diagnoses: Pain, acute or chronic: actual or potential Potential alteration in comfort, pain Goals: Patient will verbalize adequate pain control and receive pain control interventions during procedures as needed Date Initiated: 04/18/2022 Target Resolution Date: 05/10/2022 Goal Status: Active Patient/caregiver will verbalize comfort level met Date Initiated: 04/18/2022 Target Resolution Date: 05/10/2022 Goal Status: Active Interventions: Encourage patient to take pain medications as prescribed Provide  education on pain management Reposition patient for comfort Treatment Activities: Administer pain control measures as ordered : 04/18/2022 Notes: Wound/Skin Impairment Nursing Diagnoses: Knowledge deficit related to ulceration/compromised skin integrity Goals: Patient/caregiver will verbalize understanding of skin care regimen Date Initiated: 04/18/2022 Target Resolution Date: 05/10/2022 Goal Status: Active Interventions: Assess patient/caregiver ability to perform ulcer/skin care regimen upon admission and as needed Assess ulceration(s) every visit Provide education on ulcer and skin care Treatment Activities: Skin care regimen initiated : 04/18/2022 Topical wound management initiated : 04/18/2022 Notes: Electronic Signature(s) Signed: 04/25/2022 5:58:31 PM By: Deon Pilling RN, BSN Entered By: Deon Pilling on 04/25/2022 15:14:25 -------------------------------------------------------------------------------- Pain Assessment Details Patient Name: Date of Service: FRANCINE, HANNAN [place] 04/25/2022 3:00 PM Medical Record Number: [id number] Patient Account Number: [id number] Date of Birth/Sex: Treating RN: [birth year] (81 y.o. F) [place],  Bobbi Primary Care Jeffifer Rabold: MA Ok Edwards MES Other Clinician: Referring Anneke Cundy: Treating Nakema Fake/Extender: Kalman Shan MA NNING, JA MES Weeks in Treatment: 1 Active Problems Location of Pain Severity and Description of Pain Patient Has Paino No Site Locations Rate the pain. Current Pain Level: 0 Pain Management and Medication Current Pain Management: Notes per patient stinging at times. Electronic Signature(s) Signed: 04/25/2022 5:58:31 PM By: Deon Pilling RN, BSN Entered By: Deon Pilling on 04/25/2022 15:07:21 -------------------------------------------------------------------------------- Patient/Caregiver Education Details Patient Name: Date of Service: YATZIRI, WAINWRIGHT 9/21/2023andnbsp3:00 PM Medical Record Number:  [id number] Patient Account Number: [id number] Date of Birth/Gender: Treating RN: 10/16/1940 (81 y.o. [name] Primary Care Physician: MA Ok Edwards MES Other Clinician: Referring Physician: Treating Physician/Extender: Horton Marshall, JA MES Weeks in Treatment: 1 Education Assessment Education Provided To: Patient Education Topics Provided Wound/Skin Impairment: Handouts: Skin Care Do's and Dont's Methods: Explain/Verbal Responses: Reinforcements needed Electronic Signature(s) Signed: 04/25/2022 5:58:31 PM By: Deon Pilling RN, BSN Entered By: Deon Pilling on 04/25/2022 15:14:35 -------------------------------------------------------------------------------- Wound Assessment Details Patient Name: Date of Service: Gina Cline, CANNELL [place] 04/25/2022 3:00 PM Medical Record Number: [id number] Patient Account Number: [id number] Date of Birth/Sex: Treating RN: June 16, 1941 (81 y.o. [name] Primary Care Li Bobo: MA Ok Edwards MES Other Clinician: Referring Akin Yi: Treating Alam Guterrez/Extender: Kalman Shan MA NNING, JA MES Weeks in Treatment: 1 Wound Status Wound Number: 1 Primary Etiology: Open Surgical Wound Wound Location: Right, Lateral Ankle Secondary Etiology: Dehisced Wound Wounding Event: Surgical Injury Wound Status: Open Date Acquired: 02/01/2022 Weeks Of Treatment: 1 Clustered Wound: Yes Photos Wound Measurements Length: (cm) 7.4 Width: (cm) 0.4 Depth: (cm) 0.4 Clustered Quantity: 3 Area: (cm) 2. Volume: (cm) 0. % Reduction in Area: -17.5% % Reduction in Volume: -17.4% Epithelialization: Medium (34-66%) Tunneling: No 325 Undermining: No 93 Wound Description Classification: Full Thickness With Exposed Support Struct Wound Margin: Distinct, outline attached Exudate Amount: Medium Exudate Type: Serosanguineous Exudate Color: red, brown ures Foul Odor After Cleansing: No Slough/Fibrino Yes Wound Bed Granulation Amount: Medium  (34-66%) Exposed Structure Granulation Quality: Red, Pink Fascia Exposed: No Necrotic Amount: Medium (34-66%) Fat Layer (Subcutaneous Tissue) Exposed: Yes Necrotic Quality: Adherent Slough Tendon Exposed: No Muscle Exposed: No Joint Exposed: No Bone Exposed: No Assessment Notes hardware exposed to two of the three areas. Treatment Notes Wound #1 (Ankle) Wound Laterality: Right, Lateral Cleanser VASHE Discharge Instruction: ****VASHE to purchase off [place].*** Peri-Wound Care Skin Prep Discharge Instruction: Use skin prep as directed Topical Gentamicin Discharge Instruction: apply directly to wound bed. Primary Dressing Secondary Dressing Zetuvit Plus Silicone Border Dressing 7x7(in/in) Discharge Instruction: Apply silicone border over primary dressing as directed. Secured With Compression Wrap Compression Stockings Environmental education officer) Signed: 04/25/2022 5:58:31 PM By: Deon Pilling RN, BSN Entered By: Deon Pilling on 04/25/2022 15:12:44 -------------------------------------------------------------------------------- Vitals Details Patient Name: Date of Service: Gina Cline, Gina Cline [place] 04/25/2022 3:00 PM Medical Record Number: [id number] Patient Account Number: [id number] Date of Birth/Sex: Treating RN: 1941/05/14 (81 y.o. Gina Cline, Meta.Reding Primary Care Decorey Wahlert: MA Ok Edwards MES Other Clinician: Referring Jacqulin Brandenburger: Treating Yuliza Cara/Extender: Kalman Shan MA NNING, JA MES Weeks in Treatment: 1 Vital Signs Time Taken: 15:05 Temperature (F): 99 Height (in): 64 Pulse (bpm): 80 Weight (lbs): 115 Respiratory Rate (breaths/min): 20 Body Mass Index (BMI): 19.7 Blood Pressure (mmHg): 151/75 Reference Range: 80 - 120 mg / dl Electronic Signature(s) Signed: 04/25/2022 5:58:31 PM By: Deon Pilling RN, BSN Entered By: Deon Pilling on 04/25/2022 15:07:09

## 2022-04-26 NOTE — Progress Notes (Signed)
Gina Cline ([id number]) , Visit Report for 04/25/2022 Chief Complaint Document Details Patient Name: Date of Service: Gina Cline 04/25/2022 3:00 PM Medical Record Number: [id number] Patient Account Number: [id number] Date of Birth/Sex: Treating RN: August 28, 1940 (81 y.o. Gina Cline Primary Care Provider: MA Ok Edwards MES Other Clinician: Referring Provider: Treating Provider/Extender: Horton Marshall, JA MES Weeks in Treatment: 1 Information Obtained from: Patient Chief Complaint 04/18/22; patient is here for review of 2 small wounds on the right lateral lower leg Electronic Signature(s) Signed: 04/26/2022 9:20:47 AM By: Kalman Shan DO Entered By: Kalman Shan on 04/25/2022 16:17:06 -------------------------------------------------------------------------------- HPI Details Patient Name: Date of Service: Gina Cline, Gina Cline 04/25/2022 3:00 PM Medical Record Number: [id number] Patient Account Number: [id number] Date of Birth/Sex: Treating RN: 1941-05-07 (81 y.o. Gina Cline Primary Care Provider: MA Ok Edwards MES Other Clinician: Referring Provider: Treating Provider/Extender: Kalman Shan MA Ileene Patrick, JA MES Weeks in Treatment: 1 History of Present Illness HPI Description: Admission 04/18/2022 This is a 81 year old healthy woman who had a fall on 02/01/2022 she underwent ORIF of the right ankle bimalleolar fracture surgery by Dr. Posey Pronto of podiatry. The last time that she saw him she says that he use Betadine wet-to-dry. She spent some time at the beach but did not get her leg in water was seen on 04/09/2022 in urgent care with significant erythema of the right ankle lateral aspect. She was put on doxycycline at that point and the erythema seems to have improved. She is not complaining of a lot of pain but she does have drainage from the superior wound. She has not been systemically unwell. She has not been specifically addressing her open areas with any  dressing Past medical history is essentially unremarkable she has a history of a basal cell skin CA and had Mohs surgery. She is not a diabeticOr a smoker. ABI in our clinic was 1.18 on the right 9/21; patient presents for follow-up. She was admitted at last clinic visit by Dr. Dellia Nims. She had a wound culture done at last clinic visit that grew Pseudomonas Sensitive to ciprofloxacin. Dr. Dellia Nims had referred her back to Dr. Posey Pronto for Surgical reevaluation since hardware was exposed. She saw Dr. Posey Pronto on 9/20. He states that the wounds are superficial. Currently she has been placing collagen to the wound beds. Electronic Signature(s) Signed: 04/26/2022 9:20:47 AM By: Kalman Shan DO Entered By: Kalman Shan on 04/25/2022 16:45:44 -------------------------------------------------------------------------------- Physical Exam Details Patient Name: Date of Service: Gina Cline, Gina Cline [place] 04/25/2022 3:00 PM Medical Record Number: [id number] Patient Account Number: [id number] Date of Birth/Sex: Treating RN: 01-Mar-1941 (81 y.o. Gina Cline, Meta.Reding Primary Care Provider: MA Ok Edwards MES Other Clinician: Referring Provider: Treating Provider/Extender: Kalman Shan MA NNING, JA MES Weeks in Treatment: 1 Constitutional respirations regular, non-labored and within target range for patient.. Cardiovascular 2+ dorsalis pedis/posterior tibialis pulses. Psychiatric pleasant and cooperative. Notes On right lower extremity: T the distal medial aspect there are 3 open wounds 2 of these probes to hardware. There is no purulent drainage. Slight erythema to o the periwound. No increased warmth. Electronic Signature(s) Signed: 04/26/2022 9:20:47 AM By: Kalman Shan DO Entered By: Kalman Shan on 04/25/2022 16:46:09 -------------------------------------------------------------------------------- Physician Orders Details Patient Name: Date of Service: Gina Cline, Gina Cline Gina Cline 04/25/2022 3:00 PM Medical  Record Number: [id number] Patient Account Number: [id number] Date of Birth/Sex: Treating RN: May 01, 1941 (81 y.o. 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 - Dr. Heber Spencer and Tammi Klippel, Room 8 (Dr. Dellia Nims covering) Return A Other: - Pick up [place] at your local pharmacy today and start taking. Pick up topical gentamicin ointment as well as pharmacy. [employer] Vashe use to clean wounds. [facility] scheduler will call and schedule your CT scan. Anesthetic (In clinic) Topical Lidocaine 4% applied to wound bed Bathing/ Shower/ Hygiene May shower with protection but do not get wound dressing(s) wet. Edema Control - Lymphedema / SCD / Other Avoid standing for long periods of time. Wound Treatment Wound #1 - Ankle Wound Laterality: Right, Lateral Cleanser: VASHE 1 x Per Day/30 Days Discharge Instructions: ****VASHE to purchase off [place].*** Peri-Wound Care: Skin Prep 1 x Per Day/30 Days Discharge Instructions: Use skin prep as directed Topical: Gentamicin 1 x Per Day/30 Days Discharge Instructions: apply directly to wound bed. Secondary Dressing: Zetuvit Plus Silicone Border Dressing 7x7(in/in) 1 x Per Day/30 Days Discharge Instructions: Apply silicone border over primary dressing as directed. Radiology Computed Tomography (CT) Scan , right ankle Lower extremity with contrast - CT scan to right ankle/foot related to nonhealing surgical wound with hardware exposed with a positive culture pseudomonas looking  for infection. CPT code ICD [place] referral second opinion ORIF 02/01/2022, dehiscenced wound with exposed hardware, positive culture of pseudomonas. CT scan has been ordered. - (ICD10 T81.32XD - Disruption of internal operation (surgical) wound, not elsewhere classified, subsequent encounter) Patient Medications llergies: penicillin, erythromycin base A Notifications Medication Indication Start End 04/25/2022 gentamicin DOSE 1 - topical 0.1 % cream - Apply daily to the wound beds 04/25/2022 ciprofloxacin HCl DOSE 1 - oral 750 mg tablet - 1 tablet oral BID x 7 days Electronic Signature(s) Signed: 04/26/2022 9:20:47 AM By: Kalman Shan DO Previous Signature: 04/25/2022 4:31:51 PM Version By: Kalman Shan DO Previous Signature: 04/25/2022 4:26:32 PM Version By: Kalman Shan DO Entered By: Kalman Shan on 04/25/2022 16:46:22 Prescription 04/25/2022 Glenetta Borg -------------------------------------------------------------------------------- , Kalman Shan DO Patient Name: Provider: [birth year] [id number] Date of Birth: NPI#Corliss Marcus Sex: DEA #: [phone] Phone #: License #: [place] Patient Address: [address] [place], [place] [address] [place], [place] [address] [phone] Allergies penicillin; erythromycin base Provider's Orders Computed Tomography (CT) Scan , right ankle Lower extremity with contrast - CT scan to right ankle/foot related to nonhealing surgical wound with hardware exposed with a positive culture pseudomonas looking for infection. CPT code ICD 10 Hand Signature: Date(s): Prescription 04/25/2022 Cheron Every DO Patient Name: Provider: 09/23/1940 [id number] Date of Birth: NPI#: [id number] Sex: DEA #: [phone] Phone #: License #: [place] Patient Address: [address] [place], [place] [address] [place], [place] [address] [phone] Allergies penicillin; erythromycin base Provider's Orders Orthopedic - ICD10: T81.32XD - [facility] referral second opinion ORIF 02/01/2022, dehiscenced wound with exposed hardware, positive culture of pseudomonas. CT scan has been ordered. Hand Signature: Date(s): Electronic Signature(s) Signed: 04/26/2022 9:20:47 AM By: Kalman Shan DO Entered By: Kalman Shan on 04/25/2022 16:46:22 -------------------------------------------------------------------------------- Problem List Details Patient Name:  Date of Service: Gina Cline, Gina Cline 04/25/2022 3:00 PM Medical Record Number: [id number] Patient Account Number: [id number] Date of Birth/Sex: Treating RN: [birth year] (81 y.o. 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 Gina Cline Primary Care Provider: MA Ok Edwards MES Other Clinician: Referring Provider: Treating Provider/Extender: Horton Marshall, JA MES Weeks in Treatment: 1 Subjective Chief Complaint Information obtained from Patient 04/18/22; patient is here for review of 2 small wounds on the right lateral lower leg History of Present Illness (HPI) Admission 04/18/2022 This is a 81 year old healthy woman who had a fall on 02/01/2022 she underwent ORIF of the right ankle bimalleolar fracture surgery by Dr. Posey Pronto of podiatry. The last time that she saw him she says that he use Betadine wet-to-dry. She spent some time at the beach but did not get her leg in water was seen on 04/09/2022 in urgent care with significant erythema of the right ankle lateral aspect. She was put on doxycycline at that point and the erythema seems to have improved. She is not complaining of a lot of pain but she does have drainage from the superior wound. She has not been systemically unwell. 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 She is not a diabeticOr a smoker. ABI in our clinic was 1.18 on the right 9/21; patient presents for follow-up. She was admitted at last clinic visit by Dr. Dellia Nims. She had a wound culture done at last clinic visit that grew Pseudomonas Sensitive to ciprofloxacin. Dr. Dellia Nims had referred her back to Dr. Posey Pronto for Surgical reevaluation since hardware was exposed. She saw Dr. Posey Pronto on 9/20. He states that the wounds are superficial. Currently she has been placing collagen to the wound beds. Patient History Information obtained from Patient, Chart. Family History Diabetes - Father, Heart Disease - Father. Social History Never smoker, Marital Status - Married, Alcohol Use - Never, Drug Use - No History, Caffeine Use -  Never. Medical History Hospitalization/Surgery History - 02/01/2022 ORIF right ankle Dr. Posey Pronto. - Mohs surgery. Medical A Surgical History Notes nd Constitutional Symptoms (General Health) cellulitis to right ankle 04/09/2022 urgent care and 04/13/2022 ED evaluation as well until wound care appt. hypothyroidism Gastrointestinal GERD Oncologic Basal Cell Skin Ca Objective Constitutional respirations regular, non-labored and within target range for patient.. Vitals Time Taken: 3:05 PM, Height: 64 in, Weight: 115 lbs, BMI: 19.7, Temperature: 99 F, Pulse: 80 bpm, Respiratory Rate: 20 breaths/min, Blood Pressure: 151/75 mmHg. Cardiovascular 2+ dorsalis pedis/posterior tibialis pulses. Psychiatric pleasant and cooperative. General Notes: On right lower extremity: T the distal medial aspect there are 3  open wounds 2 of these probes to hardware. There is no purulent drainage. o Slight erythema to the periwound. No increased warmth. Integumentary (Hair, Skin) Wound #1 status is Open. Original cause of wound was Surgical Injury. The date acquired was: 02/01/2022. The wound has been in treatment 1 weeks. The wound is located on the Right,Lateral Ankle. The wound measures 7.4cm length x 0.4cm width x 0.4cm depth; 2.325cm^2 area and 0.93cm^3 volume. There is Fat Layer (Subcutaneous Tissue) exposed. There is no tunneling or undermining noted. There is a medium amount of serosanguineous drainage noted. The wound margin is distinct with the outline attached to the wound base. There is medium (34-66%) red, pink granulation within the wound bed. There is a medium (34-66%) amount of necrotic tissue within the wound bed including Adherent Slough. General Notes: hardware exposed to two of the three areas. 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 This was also visualized easily with a light. I am concerned that she will need the hardware removed for these wounds to heal. She was referred back to Dr. Posey Pronto Further surgical evaluation of hardware removal. He does not recommend this at this time. Patient would like a second opinion. She would like to go to orthopedic surgery in [facility]. We will place the referral. I will go ahead and get a CT scan to assess this area better. She also grew Pseudomonas on culture and I will give her ciprofloxacin. Plan Follow-up Appointments: Return Appointment in 1 week. - Dr. Heber [place] and Tammi Klippel, Room 8 (Dr. Dellia Nims covering) Other: - Pick up [place] at your local pharmacy today and start taking. Pick up topical gentamicin ointment as well as pharmacy. [employer] Vashe use to clean wounds. [facility] scheduler will call and schedule your CT scan. Anesthetic: (In clinic) Topical Lidocaine 4% applied to wound bed Bathing/ Shower/ Hygiene: May shower with protection but do not get wound dressing(s) wet. Edema Control - Lymphedema / SCD / Other: Avoid standing for long periods of time. Radiology ordered were: Computed T omography (CT) Scan , right ankle Lower extremity with contrast - CT scan to right ankle/foot related to nonhealing surgical wound with hardware exposed with a positive culture pseudomonas looking for infection. CPT code ICD 10 ordered were: Orthopedic - [facility] referral second opinion ORIF 02/01/2022, dehiscenced wound with exposed hardware, positive culture of pseudomonas. CT scan has been ordered. The following medication(s) was prescribed: gentamicin topical 0.1 % cream 1 Apply daily to the wound beds starting 04/25/2022 ciprofloxacin HCl oral 750 mg tablet 1 1 tablet oral BID x 7 days starting 04/25/2022 WOUND #1: - Ankle Wound Laterality: Right,  Lateral Cleanser: VASHE 1 x Per Day/30 Days Discharge Instructions: ****VASHE to purchase off [place].*** Peri-Wound Care: Skin Prep 1 x Per Day/30 Days Discharge Instructions: Use skin prep as directed Topical: Gentamicin 1 x Per Day/30 Days Discharge Instructions: apply directly to wound bed. Secondary Dressing: Zetuvit Plus Silicone Border Dressing 7x7(in/in) 1 x Per Day/30 Days Discharge Instructions: Apply silicone border over primary dressing as directed. 1. CT scan 2. Gentamicin ointment 3. Ciprofloxacin 4. Follow-up in 1 week 5. Referral to orthopedic surgery, [place] for second opinion Electronic Signature(s) Signed: 04/26/2022 9:20:47 AM By: Kalman Shan DO Entered By: Kalman Shan on 04/25/2022 16:47:33 -------------------------------------------------------------------------------- HxROS  Details Patient Name: Date of Service: Gina Cline, Gina Cline 04/25/2022 3:00 PM Medical Record Number: [id number] Patient Account Number: [id number] Date of Birth/Sex: Treating RN: [birth year] (81 y.o. Gina Cline Primary Care Provider: MA Ok Edwards MES Other Clinician: Referring Provider: Treating Provider/Extender: Kalman Shan MA Ileene Patrick, JA MES Weeks in Treatment: 1 Information Obtained From Patient Chart Constitutional Symptoms (General Health) Medical History: Past Medical History Notes: cellulitis to right ankle 04/09/2022 urgent care and 04/13/2022 ED evaluation as well until wound care appt. hypothyroidism Gastrointestinal Medical History: Past Medical History Notes: GERD Oncologic Medical History: Past Medical History Notes: Basal Cell Skin Ca Immunizations Pneumococcal Vaccine: Received Pneumococcal Vaccination: No Implantable Devices No devices added Hospitalization / Surgery History Type of Hospitalization/Surgery 02/01/2022 ORIF right ankle Dr. Posey Pronto Mohs surgery Family and Social History Diabetes: Yes - Father; Heart Disease: Yes - Father; Never  smoker; Marital Status - Married; Alcohol Use: Never; Drug Use: No History; Caffeine Use: Never; Financial Concerns: No; Food, Clothing or Shelter Needs: No; Support System Lacking: No; Transportation Concerns: No Electronic Signature(s) Signed: 04/25/2022 5:58:31 PM By: Deon Pilling RN, BSN Signed: 04/26/2022 9:20:47 AM By: Kalman Shan DO Entered By: Kalman Shan on 04/25/2022 16:45:58 -------------------------------------------------------------------------------- SuperBill Details Patient Name: Date of Service: Gina Cline, Gina Cline [place] 04/25/2022 Medical Record Number: [id number] Patient Account Number: [id number] Date of Birth/Sex: Treating RN: 1940-11-01 (81 y.o. 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 Z96.698 Presence of other orthopedic joint implants Facility Procedures CPT4 Code: 84132440 Description: 99213 - WOUND CARE VISIT-LEV 3 EST PT Modifier: Quantity: 1 Physician Procedures : CPT4 Code Description Modifier [id number] - WC PHYS LEVEL 4 - EST PT ICD-10 Diagnosis Description T81.32XD Disruption of internal operation (surgical) wound, not elsewhere classified, subsequent encounter L03.115 Cellulitis of right lower limb  L97.918 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

## 2022-05-01 ENCOUNTER — Encounter: Payer: Medicare HMO | Admitting: Podiatry

## 2022-05-02 ENCOUNTER — Encounter (HOSPITAL_BASED_OUTPATIENT_CLINIC_OR_DEPARTMENT_OTHER): Payer: Medicare HMO | Admitting: Internal Medicine

## 2022-05-02 DIAGNOSIS — L97811 Non-pressure chronic ulcer of other part of right lower leg limited to breakdown of skin: Secondary | ICD-10-CM | POA: Diagnosis not present

## 2022-05-02 NOTE — Progress Notes (Signed)
Gina Cline ([id number]) , Visit Report for 05/02/2022 HPI Details Patient Name: Date of Service: Gina Cline, Gina Cline 05/02/2022 3:15 PM Medical Record Number: [id number] Patient Account Number: [id number] Date of Birth/Sex: Treating RN: 30-Jun-1941 (81 y.o. F) Primary Care Provider: MA Gina Cline Other Clinician: Referring Provider: Treating Provider/Extender: Gina Cline Weeks in Treatment: 2 History of Present Illness HPI Description: Admission 04/18/2022 This is a 81 year old healthy woman who had a fall on 02/01/2022 she underwent ORIF of the right ankle bimalleolar fracture surgery by Dr. Posey Cline of podiatry. The last time that she saw him she says that he use Betadine wet-to-dry. She spent some time at the beach but did not get her leg in water was seen on 04/09/2022 in urgent care with significant erythema of the right ankle lateral aspect. She was put on doxycycline at that point and the erythema seems to have improved. She is not complaining of a lot of pain but she does have drainage from the superior wound. She has not been systemically unwell. 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 She is not a diabeticOr a smoker. ABI in our clinic was 1.18 on the right 9/21; patient presents for follow-up. She was admitted at last clinic visit by Dr. Dellia Cline. She had a wound culture done at last clinic visit that grew Pseudomonas Sensitive to ciprofloxacin. Dr. Dellia Cline had referred her back to Dr. Posey Cline for Surgical reevaluation since hardware was exposed. She saw Dr. Posey Cline on 9/20. He states that the wounds are superficial. Currently she has been placing collagen to the wound beds. 9/28; patient admitted the clinic 2 weeks ago. Culture of the superior open area grew Pseudomonas. She had been treated previously with doxycycline she  has completed week of ciprofloxacin. She is using topical gentamicin She saw Dr. Posey Cline and per the family he was not interested in removing the plate. He did recognize that the wounds probe to hardware however in 2 locations. The patient is systemically well. She had scant amount of drainage coming out of each one of the 3 open areas on the lateral right ankle. She has a CT scan of the ankle tomorrow Electronic Signature(s) Signed: 05/02/2022 4:39:24 PM By: Gina Ham MD Entered By: Gina Cline on 05/02/2022 15:42:55 -------------------------------------------------------------------------------- Physical Exam Details Patient Name: Date of Service: Gina Cline, Gina Cline [place] 05/02/2022 3:15 PM Medical Record Number: [id number] Patient Account Number: [id number] Date of Birth/Sex: Treating RN: 01-Mar-1941 (81 y.o. F) Primary Care Provider: MA Gina Cline Other Clinician: Referring Provider: Treating Provider/Extender: Gina Cline Weeks in Treatment: 2 Constitutional Patient is hypertensive.. Pulse regular and within target range for patient.Marland Kitchen Respirations regular, non-labored and within target range.. Temperature is normal and within the target range for the patient.Marland Kitchen Appears in no distress. Notes Wound exam; right lower extremity surgical incision on the right lateral ankle. There are 3 open areas superior and inferior ones clearly probe to hardware. The middle 1 does not. There is no purulent drainage and the erythema today is not visible which is an improvement from 2 weeks ago. There is no tenderness and no increased warmth Electronic Signature(s) Signed: 05/02/2022 4:39:24 PM By: Gina Ham MD Entered By: Gina Cline on 05/02/2022 15:46:34 -------------------------------------------------------------------------------- Physician Orders Details Patient Name: Date of Service: Gina Cline [place] 05/02/2022 3:15 PM Medical Record Number: [id number] Patient  Account Number: [id number] Date of Birth/Sex: Treating RN: 1941-02-21 (81 y.o. Gina Cline, Gina Cline Primary Care Provider: MA Gina Cline Other Clinician: Referring Provider: Treating Provider/Extender: Gina Cline Weeks in Treatment: 2 Verbal / Phone Orders: No Diagnosis Coding Follow-up Appointments ppointment in 1 week. - Dr. Heber Cline and Gina Cline, Room 8 Return A Other: - [employer] Vashe use to clean wounds. CT scan 05/03/22 Anesthetic (In clinic) Topical Lidocaine 4% applied to wound bed Bathing/ Shower/ Hygiene May shower with protection but do not get wound dressing(s) wet. Edema Control - Lymphedema / SCD / Other Avoid standing for long periods of time. Wound Treatment Wound #1 - Ankle Wound Laterality: Right, Lateral Cleanser: VASHE 1 x Per Day/30 Days Discharge Instructions: ****VASHE to purchase off [place].*** Peri-Wound Care: Skin Prep 1 x Per Day/30 Days Discharge Instructions: Use skin prep as directed Topical: Gentamicin 1 x Per Day/30 Days Discharge Instructions: apply directly to wound bed. Secondary Dressing: Zetuvit Plus Silicone Border Dressing 7x7(in/in) 1 x Per Day/30 Days Discharge Instructions: Apply silicone border over primary dressing as directed. Electronic Signature(s) Signed: 05/02/2022 4:19:15 PM By: Gina Hammock RN Signed: 05/02/2022 4:39:24 PM By: Gina Ham MD Entered By: Gina Cline on 05/02/2022 15:25:49 -------------------------------------------------------------------------------- Problem List Details Patient Name: Date of Service: Gina Cline, Gina Cline [place] 05/02/2022 3:15 PM Medical Record Number: [id number] Patient Account Number: [id number] Date of Birth/Sex: Treating RN: 1940-12-31 (81 y.o. 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 F) Primary Care Provider: MA Gina Cline Other Clinician: Referring Provider: Treating Provider/Extender: Gina Cline Weeks in Treatment: 2 Subjective History of Present Illness (HPI) Admission 04/18/2022 This is a 81 year old healthy woman who had a fall on 02/01/2022 she underwent ORIF of the right ankle bimalleolar fracture surgery by Dr. Posey Cline of podiatry. The last time that she saw him she says that he use Betadine wet-to-dry. She spent some time at the beach but did not get her leg in water was seen on 04/09/2022 in urgent care with significant erythema of the right ankle lateral aspect. She was put on doxycycline at that point and the erythema seems to have improved. She is not complaining of a lot of pain but she does have drainage from the superior wound. She has not been systemically unwell. 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 She is not a diabeticOr a smoker. ABI in  our clinic was 1.18 on the right 9/21; patient presents for follow-up. She was admitted at last clinic visit by Dr. Dellia Cline. She had a wound culture done at last clinic visit that grew  Pseudomonas Sensitive to ciprofloxacin. Dr. Dellia Cline had referred her back to Dr. Posey Cline for Surgical reevaluation since hardware was exposed. She saw Dr. Posey Cline on 9/20. He states that the wounds are superficial. Currently she has been placing collagen to the wound beds. 9/28; patient admitted the clinic 2 weeks ago. Culture of the superior open area grew Pseudomonas. She had been treated previously with doxycycline she has completed week of ciprofloxacin. She is using topical gentamicin She saw Dr. Posey Cline and per the family he was not interested in removing the plate. He did recognize that the wounds probe to hardware however in 2 locations. The patient is systemically well. She had scant amount of drainage coming out of each one of the 3 open areas on the lateral right ankle. She has a CT scan of the ankle tomorrow Objective Constitutional Patient is hypertensive.. Pulse regular and within target range for patient.Marland Kitchen Respirations regular, non-labored and within target range.. Temperature is normal and within the target range for the patient.Marland Kitchen Appears in no distress. Vitals Time Taken: 3:16 PM, Height: 64 in, Weight: 115 lbs, BMI: 19.7, Temperature: 99 F, Pulse: 81 bpm, Respiratory Rate: 17 breaths/min, Blood Pressure: 147/73 mmHg. General Notes: Wound exam; right lower extremity surgical incision on the right lateral ankle. There are 3 open areas superior and inferior ones clearly probe to hardware. The middle 1 does not. There is no purulent drainage and the erythema today is not visible which is an improvement from 2 weeks ago. There is no tenderness and no increased warmth Integumentary (Hair, Skin) Wound #1 status is Open. Original cause of wound was Surgical Injury. The date acquired was: 02/01/2022. The wound  has been in treatment 2 weeks. The wound is located on the Right,Lateral Ankle. The wound measures 7.6cm length x 0.5cm width x 0.4cm depth; 2.985cm^2 area and 1.194cm^3 volume. There is Fat Layer (Subcutaneous Tissue) exposed. There is no tunneling or undermining noted. There is a medium amount of serosanguineous drainage noted. The wound margin is distinct with the outline attached to the wound base. There is medium (34-66%) red, pink granulation within the wound bed. There is a medium (34-66%) amount of necrotic tissue within the wound bed including Adherent Slough. 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 - Dr. Heber [place] and Gina Cline, Room 8 Other: - [employer] Vashe use to clean wounds. CT scan 05/03/22 Anesthetic: (In clinic) Topical Lidocaine 4% applied to wound bed Bathing/ Shower/ Hygiene: May shower with protection but do not get wound dressing(s) wet. Edema Control - Lymphedema / SCD / Other: Avoid standing for long periods of time. WOUND #1: - Ankle Wound Laterality: Right, Lateral Cleanser: VASHE 1 x Per Day/30 Days Discharge Instructions: ****VASHE to purchase off [place].*** Peri-Wound Care: Skin Prep 1 x Per Day/30 Days Discharge Instructions: Use skin prep as directed Topical: Gentamicin 1 x Per Day/30 Days Discharge Instructions: apply directly to wound bed. Secondary Dressing: Zetuvit Plus Silicone Border Dressing 7x7(in/in) 1 x Per Day/30 Days Discharge Instructions: Apply silicone border over primary dressing as directed. 1. Generally improved from 2 weeks ago and certainly improved from her ER visit of 1 month ago. Clearly there  was an infection here hopefully the antibiotic she has had both orally and topically have eradicated  this 2. We continued the topical gentamicin 3. Within the next week or 2 I would consider endoform AG 4. Hopefully she will not have recurrent infections that we will dictate a surgical approach here. 5. CT scan of the ankle is tomorrow Electronic Signature(s) Signed: 05/02/2022 4:39:24 PM By: Gina Ham MD Entered By: Gina Cline on 05/02/2022 15:48:10 -------------------------------------------------------------------------------- SuperBill Details Patient Name: Date of Service: Gina Cline, Gina Cline [place] 05/02/2022 Medical Record Number: [id number] Patient Account Number: [id number] Date of Birth/Sex: Treating RN: 05/13/1941 (81 y.o. 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 Z96.698 Presence of other orthopedic joint implants Facility Procedures CPT4 Code: [id number] Description: 99213 - WOUND CARE VISIT-LEV 3 EST PT Modifier: Quantity: 1 Physician Procedures : CPT4 Code Description Modifier [id number] 99213 - WC PHYS LEVEL 3 - EST PT ICD-10 Diagnosis Description T81.32XD Disruption of internal operation (surgical) wound, not elsewhere classified, subsequent encounter L97.918 Non-pressure chronic ulcer of  unspecified part of right lower leg with other specified severity L03.115 Cellulitis of right lower limb Z96.698 Presence of other orthopedic joint implants Quantity: 1 Electronic Signature(s) Signed: 05/02/2022 4:39:24 PM By: Gina Ham MD Entered  By: Gina Cline on 05/02/2022 15:48:35

## 2022-05-03 ENCOUNTER — Ambulatory Visit (HOSPITAL_COMMUNITY)
Admission: RE | Admit: 2022-05-03 | Discharge: 2022-05-03 | Disposition: A | Discharge status: Home / Self Care | Payer: Medicare HMO | Source: Ambulatory Visit | Attending: Internal Medicine | Admitting: Internal Medicine

## 2022-05-03 DIAGNOSIS — T8189XA Other complications of procedures, not elsewhere classified, initial encounter: Secondary | ICD-10-CM | POA: Diagnosis not present

## 2022-05-03 MED ORDER — IOHEXOL 300 MG/ML  SOLN
100.0000 mL | Freq: Once | INTRAMUSCULAR | Status: AC | PRN
  Administered 2022-05-03: 100 mL via INTRAVENOUS

## 2022-05-07 ENCOUNTER — Encounter (HOSPITAL_BASED_OUTPATIENT_CLINIC_OR_DEPARTMENT_OTHER): Payer: Medicare HMO | Attending: Internal Medicine | Admitting: Internal Medicine

## 2022-05-07 DIAGNOSIS — T8132XD Disruption of internal operation (surgical) wound, not elsewhere classified, subsequent encounter: Secondary | ICD-10-CM | POA: Diagnosis not present

## 2022-05-07 DIAGNOSIS — E039 Hypothyroidism, unspecified: Secondary | ICD-10-CM | POA: Diagnosis not present

## 2022-05-07 DIAGNOSIS — L03115 Cellulitis of right lower limb: Secondary | ICD-10-CM | POA: Diagnosis not present

## 2022-05-07 DIAGNOSIS — L97918 Non-pressure chronic ulcer of unspecified part of right lower leg with other specified severity: Secondary | ICD-10-CM | POA: Insufficient documentation

## 2022-05-07 DIAGNOSIS — Z96698 Presence of other orthopedic joint implants: Secondary | ICD-10-CM | POA: Diagnosis not present

## 2022-05-07 NOTE — Progress Notes (Addendum)
Gina Cline, Gina Cline ([id number]) [id number] Page 1 of 8 Visit Report for 05/07/2022 Arrival Information Details Patient Name: Date of Service: Gina Cline, Gina Cline 05/07/2022 3:15 PM Medical Record Number: [id number] Patient Account Number: [id number] Date of Birth/Sex: Treating RN: 1941-04-08 (81 y.o. Gina Cline, Gina Cline Primary Care Gina Cline: Gina Cline Other Clinician: Referring Gina Cline: Treating Gina Cline/Extender: Gina Shan Gina NNING, JA Cline Weeks in Cline: 2 Visit Information History Since Last Visit Added or deleted any medications: No Patient Arrived: Ambulatory Any new allergies or adverse reactions: No Arrival Time: 15:06 Had a fall or experienced change in No Accompanied By: husband activities of daily living that may affect Transfer Assistance: None risk of falls: Patient Requires Transmission-Based Precautions: No Signs or symptoms of abuse/neglect since last visito No Patient Has Alerts: No Hospitalized since last visit: No Implantable device outside of the clinic excluding No cellular tissue based products placed in the center since last visit: Has Dressing in Place as Prescribed: Yes Pain Present Now: No Electronic Signature(s) Signed: 05/07/2022 4:28:27 PM By: Gina Cline Entered By: Gina Cline on 05/07/2022 15:08:17 -------------------------------------------------------------------------------- Clinic Level of Care Assessment Details Patient Name: Date of Service: Gina Cline, Gina Cline 05/07/2022 3:15 PM Medical Record Number: [id number] Patient Account Number: [id number] Date of Birth/Sex: Treating RN: [birth year] (81 y.o. Gina Cline, Gina Cline Primary Care Azarria Balint: Gina Cline Other Clinician: Referring Gina Cline: Treating Gina Cline/Extender: Gina Shan Gina NNING, JA Cline Weeks in Cline: 2 Clinic Level of Care Assessment Items TOOL 4 Quantity Score X- 1 0 Use when only an Gina Cline is performed on FOLLOW-UP  visit ASSESSMENTS - Nursing Assessment / Reassessment X- 1 10 Reassessment of Co-morbidities (includes updates in patient status) X- 1 5 Reassessment of Adherence to Cline Plan ASSESSMENTS - Wound and Skin A ssessment / Reassessment X - Simple Wound Assessment / Reassessment - one wound 1 5 '[]'$  - 0 Complex Wound Assessment / Reassessment - multiple wounds '[]'$  - 0 Dermatologic / Skin Assessment (not related to wound area) ASSESSMENTS - Focused Assessment X- 1 5 Circumferential Edema Measurements - multi extremities '[]'$  - 0 Nutritional Assessment / Counseling / Intervention Gina Cline ([id number]) [id number] Page 2 of 8 '[]'$  - 0 Lower Extremity Assessment (monofilament, tuning fork, pulses) '[]'$  - 0 Peripheral Arterial Disease Assessment (using hand held doppler) ASSESSMENTS - Ostomy and/or Continence Assessment and Care '[]'$  - 0 Incontinence Assessment and Management '[]'$  - 0 Ostomy Care Assessment and Management (repouching, etc.) PROCESS - Coordination of Care X - Simple Patient / Family Education for ongoing care 1 15 '[]'$  - 0 Complex (extensive) Patient / Family Education for ongoing care X- 1 10 Staff obtains Programmer, systems, Records, T Results / Process Orders est '[]'$  - 0 Staff telephones HHA, Nursing Homes / Clarify orders / etc '[]'$  - 0 Routine Transfer to another Facility (non-emergent condition) '[]'$  - 0 Routine Hospital Admission (non-emergent condition) '[]'$  - 0 New Admissions / Biomedical engineer / Ordering NPWT Apligraf, etc. , '[]'$  - 0 Emergency Hospital Admission (emergent condition) X- 1 10 Simple Discharge Coordination '[]'$  - 0 Complex (extensive) Discharge Coordination PROCESS - Special Needs '[]'$  - 0 Pediatric / Minor Patient Management '[]'$  - 0 Isolation Patient Management '[]'$  - 0 Hearing / Language / Visual special needs '[]'$  - 0 Assessment of Community assistance (transportation, D/C planning, etc.) '[]'$  - 0 Additional assistance /  Altered mentation '[]'$  - 0 Support Surface(s) Assessment (bed, cushion, seat, etc.) INTERVENTIONS - Wound Cleansing / Measurement X - Simple Wound Cleansing - one wound 1 5 '[]'$  -  0 Complex Wound Cleansing - multiple wounds X- 1 5 Wound Imaging (photographs - any number of wounds) '[]'$  - 0 Wound Tracing (instead of photographs) X- 1 5 Simple Wound Measurement - one wound '[]'$  - 0 Complex Wound Measurement - multiple wounds INTERVENTIONS - Wound Dressings X - Small Wound Dressing one or multiple wounds 1 10 '[]'$  - 0 Medium Wound Dressing one or multiple wounds '[]'$  - 0 Large Wound Dressing one or multiple wounds X- 1 5 Application of Medications - topical '[]'$  - 0 Application of Medications - injection INTERVENTIONS - Miscellaneous '[]'$  - 0 External ear exam '[]'$  - 0 Specimen Collection (cultures, biopsies, blood, body fluids, etc.) '[]'$  - 0 Specimen(s) / Culture(s) sent or taken to Lab for analysis '[]'$  - 0 Patient Transfer (multiple staff / Civil Service fast streamer / Similar devices) '[]'$  - 0 Simple Staple / Suture removal (25 or less) '[]'$  - 0 Complex Staple / Suture removal (26 or more) '[]'$  - 0 Hypo / Hyperglycemic Management (close monitor of Blood Glucose) Gina Cline, Gina Cline ([id number]) [id number] Page 3 of 8 '[]'$  - 0 Ankle / Brachial Index (ABI) - do not check if billed separately X- 1 5 Vital Signs Has the patient been seen at the hospital within the last three years: Yes Total Score: 95 Level Of Care: New/Established - Level 3 Electronic Signature(s) Signed: 05/07/2022 4:30:19 PM By: Gina Hammock RN Entered By: Gina Cline on 05/07/2022 15:48:05 -------------------------------------------------------------------------------- Encounter Discharge Information Details Patient Name: Date of Service: Gina Cline, Gina Cline [place] 05/07/2022 3:15 PM Medical Record Number: [id number] Patient Account Number: [id number] Date of Birth/Sex: Treating RN: 02/19/1941 (81 y.o. Gina Cline,  Gina Cline Primary Care Ames Hoban: Gina Cline Other Clinician: Referring Gisella Alwine: Treating Luise Yamamoto/Extender: Gina Shan Gina NNING, JA Cline Weeks in Cline: 2 Encounter Discharge Information Items Discharge Condition: Stable Ambulatory Status: Ambulatory Discharge Destination: Home Transportation: Private Auto Accompanied By: husband Schedule Follow-up Appointment: Yes Clinical Summary of Care: Patient Declined Electronic Signature(s) Signed: 05/07/2022 4:30:19 PM By: Gina Hammock RN Entered By: Gina Cline on 05/07/2022 15:57:19 -------------------------------------------------------------------------------- Lower Extremity Assessment Details Patient Name: Date of Service: Gina Cline, Gina Cline [place] 05/07/2022 3:15 PM Medical Record Number: [id number] Patient Account Number: [id number] Date of Birth/Sex: Treating RN: [birth year] (81 y.o. Gina Cline, Gina Cline Primary Care Anneta Rounds: Gina Cline Other Clinician: Referring Egor Fullilove: Treating Duard Spiewak/Extender: Gina Shan Gina NNING, JA Cline Weeks in Cline: 2 Edema Assessment Assessed: [Left: No] [Right: No] Edema: [Left: N] [Right: o] Calf Left: Right: Point of Measurement: 34 cm From Medial Instep 28.5 cm Ankle Left: Right: Point of Measurement: 10 cm From Medial Instep 17 cm Vascular Assessment YARELI, CARTHEN ([id number]) [Right:[id number] Page 4 of 8] Pulses: Dorsalis Pedis Palpable: [Right:Yes] Electronic Signature(s) Signed: 05/07/2022 4:28:27 PM By: Gina Cline Signed: 05/07/2022 4:30:19 PM By: Gina Hammock RN Entered By: Gina Cline on 05/07/2022 15:14:56 -------------------------------------------------------------------------------- Multi Wound Chart Details Patient Name: Date of Service: Gina Cline, Gina Cline [place] 05/07/2022 3:15 PM Medical Record Number: [id number] Patient Account Number: [id number] Date of Birth/Sex: Treating RN: [birth year] (81 y.o. Gina Cline,  Gina Cline Primary Care Roniyah Llorens: Gina Cline Other Clinician: Referring Caillou Minus: Treating Abhiraj Dozal/Extender: Gina Shan Gina NNING, JA Cline Weeks in Cline: 2 Vital Signs Height(in): 64 Pulse(bpm): 76 Weight(lbs): 115 Blood Pressure(mmHg): 154/79 Body Mass Index(BMI): 19.7 Temperature(F): 98.5 Respiratory Rate(breaths/min): 18 [1:Photos:] [N/A:N/A] Right, Lateral Ankle N/A N/A Wound Location: Surgical Injury N/A N/A Wounding Event: Open Surgical Wound N/A N/A Primary Etiology: Dehisced Wound N/A N/A Secondary Etiology: 02/01/2022 N/A N/A Date Acquired: 2 N/A N/A [place]  of Cline: Open N/A N/A Wound Status: No N/A N/A Wound Recurrence: Yes N/A N/A Clustered Wound: 3 N/A N/A Clustered Quantity: 7.4x0.4x0.2 N/A N/A Measurements L x W x D (cm) 2.325 N/A N/A A (cm) : rea 0.465 N/A N/A Volume (cm) : -17.50% N/A N/A % Reduction in A rea: 41.30% N/A N/A % Reduction in Volume: Full Thickness With Exposed Support N/A N/A Classification: Structures Medium N/A N/A Exudate Amount: Serosanguineous N/A N/A Exudate Type: red, brown N/A N/A Exudate Color: Distinct, outline attached N/A N/A Wound Margin: Medium (34-66%) N/A N/A Granulation Amount: Red, Pink N/A N/A Granulation Quality: Medium (34-66%) N/A N/A Necrotic Amount: Fat Layer (Subcutaneous Tissue): Yes N/A N/A Exposed Structures: Fascia: No Tendon: No Muscle: No Joint: No Bone: No Medium (34-66%) N/A N/A Epithelialization: Excoriation: No N/A N/A Periwound Skin Texture: Induration: No Callus: No Gina Cline, Gina Cline ([id number]) [phone].pdf Page 5 of 8 Crepitus: No Rash: No Scarring: No Maceration: No N/A N/A Periwound Skin Moisture: Dry/Scaly: No Erythema: Yes N/A N/A Periwound Skin Color: Atrophie Blanche: No Cyanosis: No Ecchymosis: No Hemosiderin Staining: No Mottled: No Pallor: No Rubor: No Circumferential N/A N/A Erythema Location: Measured:  0.5cm N/A N/A Erythema Measurement: No Abnormality N/A N/A Temperature: Cline Notes Wound #1 (Ankle) Wound Laterality: Right, Lateral Cleanser VASHE Discharge Instruction: ****VASHE to purchase off [address].*** Peri-Wound Care Skin Prep Discharge Instruction: Use skin prep as directed Topical Gentamicin Discharge Instruction: apply directly to wound bed. Primary Dressing Secondary Dressing Zetuvit Plus Silicone Border Dressing 7x7(in/in) Discharge Instruction: Apply silicone border over primary dressing as directed. Secured With Compression Wrap Compression Stockings Environmental education officer) Signed: 05/07/2022 4:30:19 PM By: Gina Hammock RN Signed: 05/07/2022 4:41:45 PM By: Gina Shan DO Entered By: Gina Cline on 05/07/2022 16:21:54 -------------------------------------------------------------------------------- Multi-Disciplinary Care Plan Details Patient Name: Date of Service: Gina Cline, Gina Cline [place] 05/07/2022 3:15 PM Medical Record Number: [id number] Patient Account Number: [id number] Date of Birth/Sex: Treating RN: Jan 04, 1941 (81 y.o. Gina Cline, Gina Cline Primary Care Crayton Savarese: Gina Cline Other Clinician: Referring Sihaam Chrobak: Treating Kayzen Kendzierski/Extender: Horton Marshall, JA Cline Weeks in Cline: 2 Active Inactive Electronic Signature(s) Signed: 05/16/2022 10:07:22 AM By: Deon Pilling RN, BSN Signed: 08/07/2022 5:36:43 PM By: Gina Hammock RN Previous Signature: 05/07/2022 4:30:19 PM Version By: Gina Hammock RN Gina Cline, Dian ([id number]) [id number] Page 6 of 8 Previous Signature: 05/07/2022 4:30:19 PM Version By: Gina Hammock RN Entered By: Deon Pilling on 05/16/2022 10:07:21 -------------------------------------------------------------------------------- Pain Assessment Details Patient Name: Date of Service: Gina Cline, Gina Cline [place] 05/07/2022 3:15 PM Medical Record Number: [id number] Patient Account  Number: [id number] Date of Birth/Sex: Treating RN: 06/06/1941 (81 y.o. Gina Cline, Gina Cline Primary Care Amory Zbikowski: Gina Cline Other Clinician: Referring Other Atienza: Treating Consuello Lassalle/Extender: Gina Shan Gina NNING, JA Cline Weeks in Cline: 2 Active Problems Location of Pain Severity and Description of Pain Patient Has Paino No Site Locations Character of Pain Describe the Pain: Burning, Other: stinging Pain Management and Medication Current Pain Management: Electronic Signature(s) Signed: 05/07/2022 4:28:27 PM By: Gina Cline Signed: 05/07/2022 4:30:19 PM By: Gina Hammock RN Entered By: Gina Cline on 05/07/2022 15:08:39 -------------------------------------------------------------------------------- Patient/Caregiver Education Details Patient Name: Date of Service: Gina Cline, Gina Cline RIA 10/3/2023andnbsp3:15 PM Medical Record Number: [id number] Patient Account Number: [id number] Date of Birth/Gender: Treating RN: 02-06-1941 (81 y.o. Gina Cline, Gina Cline Primary Care Physician: Gina Cline Other Clinician: Referring Physician: Treating Physician/Extender: Horton Marshall, JA Cline Weeks in Cline: 2 Education [address] [place], [place] ([id number]) [id number] Page 7 of 8 Education Provided To: Patient Education Topics Provided Pain:  Methods: Explain/Verbal Responses: Reinforcements needed, State content correctly Electronic Signature(s) Signed: 05/07/2022 4:30:19 PM By: Gina Hammock RN Entered By: Gina Cline on 05/07/2022 15:47:35 -------------------------------------------------------------------------------- Wound Assessment Details Patient Name: Date of Service: Gina Cline, Gina Cline [place] 05/07/2022 3:15 PM Medical Record Number: [id number] Patient Account Number: [id number] Date of Birth/Sex: Treating RN: 07-Feb-1941 (81 y.o. Gina Cline, Gina Cline Primary Care Taiwana Willison: Gina Cline Other Clinician: Referring  Tabbatha Bordelon: Treating Falon Huesca/Extender: Gina Shan Gina NNING, JA Cline Weeks in Cline: 2 Wound Status Wound Number: 1 Primary Etiology: Open Surgical Wound Wound Location: Right, Lateral Ankle Secondary Etiology: Dehisced Wound Wounding Event: Surgical Injury Wound Status: Open Date Acquired: 02/01/2022 Weeks Of Cline: 2 Clustered Wound: Yes Photos Wound Measurements Length: (cm) Width: (cm) Depth: (cm) Clustered Quantity: Area: (cm) Volume: (cm) 7.4 % Reduction in Area: -17.5% 0.4 % Reduction in Volume: 41.3% 0.2 Epithelialization: Medium (34-66%) 3 Tunneling: No 2.325 Undermining: No 0.465 Wound Description Classification: Full Thickness With Exposed Suppo Wound Margin: Distinct, outline attached Exudate Amount: Medium Exudate Type: Serosanguineous Exudate Color: red, brown rt Structures Foul Odor After Cleansing: No Slough/Fibrino Yes Wound Bed Granulation Amount: Medium (34-66%) Exposed Structure Granulation Quality: Red, Pink Fascia Exposed: No Necrotic Amount: Medium (34-66%) Fat Layer (Subcutaneous Tissue) Exposed: Yes Necrotic Quality: Adherent Slough Tendon Exposed: No Muscle Exposed: No Glenetta Borg ([id number]) [id number] Page 8 of 8 Joint Exposed: No Bone Exposed: No Periwound Skin Texture Texture Color No Abnormalities Noted: Yes No Abnormalities Noted: No Atrophie Blanche: No Moisture Cyanosis: No No Abnormalities Noted: Yes Ecchymosis: No Erythema: Yes Erythema Location: Circumferential Erythema Measurement: Measured 0.5 cm Hemosiderin Staining: No Mottled: No Pallor: No Rubor: No Temperature / Pain Temperature: No Abnormality Electronic Signature(s) Signed: 05/07/2022 4:28:27 PM By: Gina Cline Signed: 05/07/2022 4:30:19 PM By: Gina Hammock RN Entered By: Gina Cline on 05/07/2022 15:18:49 -------------------------------------------------------------------------------- Vitals  Details Patient Name: Date of Service: JENNIE, HANNAY [place] 05/07/2022 3:15 PM Medical Record Number: [id number] Patient Account Number: [id number] Date of Birth/Sex: Treating RN: [birth year] (81 y.o. Gina Cline, Gina Cline Primary Care Yamil Dougher: Gina Cline Other Clinician: Referring Khadijatou Borak: Treating Hanson Medeiros/Extender: Gina Shan Gina NNING, JA Cline Weeks in Cline: 2 Vital Signs Time Taken: 15:08 Temperature (F): 98.5 Height (in): 64 Pulse (bpm): 76 Weight (lbs): 115 Respiratory Rate (breaths/min): 18 Body Mass Index (BMI): 19.7 Blood Pressure (mmHg): 154/79 Reference Range: 80 - 120 mg / dl Electronic Signature(s) Signed: 05/07/2022 4:28:27 PM By: Gina Cline Entered By: Gina Cline on 05/07/2022 15:08:58

## 2022-05-07 NOTE — Progress Notes (Signed)
CYNDA SOULE ([id number]) , Visit Report for 05/02/2022 Arrival Information Details Patient Name: Date of Service: TIRA, LAFFERTY 05/02/2022 3:15 PM Medical Record Number: [id number] Patient Account Number: [id number] Date of Birth/Sex: Treating RN: Dec 25, 1940 (81 y.o. Tonita Phoenix, Lauren Primary Care Mack Alvidrez: MA Ok Edwards MES Other Clinician: Referring Mandrell Vangilder: Treating Samara Stankowski/Extender: Roswell Nickel, JA MES Weeks in Treatment: 2 Visit Information History Since Last Visit Added or deleted any medications: No Patient Arrived: Ambulatory Any new allergies or adverse reactions: No Arrival Time: 15:16 Had a fall or experienced change in No Accompanied By: Husband activities of daily living that may affect Transfer Assistance: None risk of falls: Patient Identification Verified: Yes Signs or symptoms of abuse/neglect since last visito No Secondary Verification Process Completed: Yes Hospitalized since last visit: No Patient Requires Transmission-Based Precautions: No Implantable device outside of the clinic excluding No Patient Has Alerts: No cellular tissue based products placed in the center since last visit: Has Dressing in Place as Prescribed: Yes Pain Present Now: No Electronic Signature(s) Signed: 05/02/2022 4:19:15 PM By: Rhae Hammock RN Entered By: Rhae Hammock on 05/02/2022 15:16:37 -------------------------------------------------------------------------------- Clinic Level of Care Assessment Details Patient Name: Date of Service: MISTINA, COATNEY 05/02/2022 3:15 PM Medical Record Number: [id number] Patient Account Number: [id number] Date of Birth/Sex: Treating RN: 1940/12/30 (81 y.o. Tonita Phoenix, Lauren Primary Care Polly Barner: MA Ok Edwards MES Other Clinician: Referring Bracken Moffa: Treating Chela Sutphen/Extender: Roswell Nickel, JA MES Weeks in Treatment: 2 Clinic Level of Care Assessment Items TOOL 4 Quantity Score X- 1 0 Use when  only an EandM is performed on FOLLOW-UP visit ASSESSMENTS - Nursing Assessment / Reassessment X- 1 10 Reassessment of Co-morbidities (includes updates in patient status) X- 1 5 Reassessment of Adherence to Treatment Plan ASSESSMENTS - Wound and Skin A ssessment / Reassessment X - Simple Wound Assessment / Reassessment - one wound 1 5 _0  - 0 Complex Wound Assessment / Reassessment - multiple wounds _1  - 0 Dermatologic / Skin Assessment (not related to wound area) ASSESSMENTS - Focused Assessment X- 1 5 Circumferential Edema Measurements - multi extremities _2  - 0 Nutritional Assessment / Counseling / Intervention _3  - 0 Lower Extremity Assessment (monofilament, tuning fork, pulses) _4  - 0 Peripheral Arterial Disease Assessment (using hand held doppler) ASSESSMENTS - Ostomy and/or Continence Assessment and Care _5  - 0 Incontinence Assessment and Management _6  - 0 Ostomy Care Assessment and Management (repouching, etc.) PROCESS - Coordination of Care _7  - 0 Simple Patient / Family Education for ongoing care X- 1 20 Complex (extensive) Patient / Family Education for ongoing care X- 1 10 Staff obtains Programmer, systems, Records, T Results / Process Orders est _8  - 0 Staff telephones HHA, Nursing Homes / Clarify orders / etc _9  - 0 Routine Transfer to another Facility (non-emergent condition) _10  - 0 Routine Hospital Admission (non-emergent condition) _11  - 0 New Admissions / Biomedical engineer / Ordering NPWT Apligraf, etc. , _12  - 0 Emergency Hospital Admission (emergent condition) _13  - 0 Simple Discharge Coordination X- 1 15 Complex (extensive) Discharge Coordination PROCESS - Special Needs _14  - 0 Pediatric / Minor Patient Management _15  - 0 Isolation Patient Management _16  - 0 Hearing / Language / Visual special needs _17  - 0 Assessment of Community assistance (transportation, D/C planning, etc.) _18  - 0 Additional assistance / Altered mentation _19  - 0 Support  Surface(s) Assessment (bed, cushion, seat, etc.) INTERVENTIONS - Wound Cleansing / Measurement X - Simple Wound Cleansing - one wound 1 5 _20  - 0 Complex Wound  Cleansing - multiple wounds X- 1 5 Wound Imaging (photographs - any number of wounds) _0  - 0 Wound Tracing (instead of photographs) X- 1 5 Simple Wound Measurement - one wound _1  - 0 Complex Wound Measurement - multiple wounds INTERVENTIONS - Wound Dressings X - Small Wound Dressing one or multiple wounds 1 10 _2  - 0 Medium Wound Dressing one or multiple wounds _3  - 0 Large Wound Dressing one or multiple wounds X- 1 5 Application of Medications - topical <[id number]>_6<\/[id number]>_3  - 0 Application of Medications - injection INTERVENTIONS - Miscellaneous _5  - 0 External ear exam _6  - 0 Specimen Collection (cultures, biopsies, blood, body fluids, etc.) _7  - 0 Specimen(s) / Culture(s) sent or taken to Lab for analysis _8  - 0 Patient Transfer (multiple staff / Civil Service fast streamer / Similar devices) _9  - 0 Simple Staple / Suture removal (25 or less) _10  - 0 Complex Staple / Suture removal (26 or more) _11  - 0 Hypo / Hyperglycemic Management (close monitor of Blood Glucose) _12  - 0 Ankle / Brachial Index (ABI) - do not check if billed separately X- 1 5 Vital Signs Has the patient been seen at the hospital within the last three years: Yes Total Score: 105 Level Of Care: New/Established - Level 3 Electronic Signature(s) Signed: 05/02/2022 4:19:15 PM By: Rhae Hammock RN Entered By: Rhae Hammock on 05/02/2022 15:28:02 -------------------------------------------------------------------------------- Encounter Discharge Information Details Patient Name: Date of Service: HONORE, WIPPERFURTH [place] 05/02/2022 3:15 PM Medical Record Number: [id number] Patient Account Number: [id number] Date of Birth/Sex: Treating RN: [birth year] (81 y.o. Tonita Phoenix, Lauren Primary Care Kailo Kosik: MA Ok Edwards MES Other Clinician: Referring Jennifer Payes: Treating Chana Lindstrom/Extender:  Roswell Nickel, JA MES Weeks in Treatment: 2 Encounter Discharge Information Items Discharge Condition: Stable Ambulatory Status: Ambulatory Discharge Destination: Home Transportation: Private Auto Accompanied By: husband Schedule Follow-up Appointment: Yes Clinical Summary of Care: Patient Declined Electronic Signature(s) Signed: 05/02/2022 4:19:15 PM By: Rhae Hammock RN Entered By: Rhae Hammock on 05/02/2022 15:28:43 -------------------------------------------------------------------------------- Lower Extremity Assessment Details Patient Name: Date of Service: JANEEN, WATSON [place] 05/02/2022 3:15 PM Medical Record Number: [id number] Patient Account Number: [id number] Date of Birth/Sex: Treating RN: 1941/06/21 (81 y.o. Tonita Phoenix, Lauren Primary Care Jashay Roddy: MA Ok Edwards MES Other Clinician: Referring Abbygayle Helfand: Treating Carren Blakley/Extender: Roswell Nickel, JA MES Weeks in Treatment: 2 Edema Assessment Assessed: [Left: No] [Right: Yes] Edema: [Left: N] [Right: o] Calf Left: Right: Point of Measurement: 34 cm From Medial Instep 27.4 cm Ankle Left: Right: Point of Measurement: 10 cm From Medial Instep 17 cm Vascular Assessment Pulses: Dorsalis Pedis Palpable: [Right:Yes] Posterior Tibial Palpable: [Right:Yes] Electronic Signature(s) Signed: 05/02/2022 4:19:15 PM By: Rhae Hammock RN Entered By: Rhae Hammock on 05/02/2022 15:17:50 -------------------------------------------------------------------------------- Multi Wound Chart Details Patient Name: Date of Service: SISSI, PADIA [place] 05/02/2022 3:15 PM Medical Record Number: [id number] Patient Account Number: [id number] Date of Birth/Sex: Treating RN: [birth year] (81 y.o. F) Primary Care Daryel Kenneth: MA Ok Edwards MES Other Clinician: Referring Odilia Damico: Treating Nimco Bivens/Extender: Roswell Nickel, JA MES Weeks in Treatment: 2 Vital Signs Height(in): 64 Pulse(bpm):  81 Weight(lbs): 115 Blood Pressure(mmHg): 147/73 Body Mass Index(BMI): 19.7 Temperature(F): 99 Respiratory Rate(breaths/min): 17 Photos: [N/A:N/A] Right, Lateral Ankle N/A N/A Wound Location: Surgical Injury N/A N/A Wounding Event: Open Surgical Wound N/A N/A Primary Etiology: Dehisced Wound N/A N/A Secondary Etiology: 02/01/2022 N/A N/A Date Acquired: 2 N/A N/A Weeks of Treatment: Open N/A N/A Wound Status: No N/A N/A Wound Recurrence: Yes N/A N/A Clustered Wound: 3 N/A N/A Clustered Quantity: 7.6x0.5x0.4 N/A  N/A Measurements L x W x D (cm) 2.985 N/A N/A A (cm) : rea 1.194 N/A N/A Volume (cm) : -50.80% N/A N/A % Reduction in A rea: -50.80% N/A N/A % Reduction in Volume: Full Thickness With Exposed Support N/A N/A Classification: Structures Medium N/A N/A Exudate Amount: Serosanguineous N/A N/A Exudate Type: red, brown N/A N/A Exudate Color: Distinct, outline attached N/A N/A Wound Margin: Medium (34-66%) N/A N/A Granulation Amount: Red, Pink N/A N/A Granulation Quality: Medium (34-66%) N/A N/A Necrotic Amount: Fat Layer (Subcutaneous Tissue): Yes N/A N/A Exposed Structures: Fascia: No Tendon: No Muscle: No Joint: No Bone: No Medium (34-66%) N/A N/A Epithelialization: Plate visible N/A N/A Assessment Notes: Treatment Notes Wound #1 (Ankle) Wound Laterality: Right, Lateral Cleanser VASHE Discharge Instruction: ****VASHE to purchase off [place].*** Peri-Wound Care Skin Prep Discharge Instruction: Use skin prep as directed Topical Gentamicin Discharge Instruction: apply directly to wound bed. Primary Dressing Secondary Dressing Zetuvit Plus Silicone Border Dressing 7x7(in/in) Discharge Instruction: Apply silicone border over primary dressing as directed. Secured With Compression Wrap Compression Stockings Environmental education officer) Signed: 05/02/2022 4:39:24 PM By: Linton Ham MD Entered By: Linton Ham on 05/02/2022  15:41:01 -------------------------------------------------------------------------------- Multi-Disciplinary Care Plan Details Patient Name: Date of Service: TRIVIA, HEFFELFINGER [place] 05/02/2022 3:15 PM Medical Record Number: [id number] Patient Account Number: [id number] Date of Birth/Sex: Treating RN: 03-Apr-1941 (81 y.o. Tonita Phoenix, Lauren Primary Care Talisa Petrak: MA Ok Edwards MES Other Clinician: Referring Gabreal Worton: Treating Enrika Aguado/Extender: Roswell Nickel, JA MES Weeks in Treatment: 2 Active Inactive Necrotic Tissue Nursing Diagnoses: Impaired tissue integrity related to necrotic/devitalized tissue Goals: Necrotic/devitalized tissue will be minimized in the wound bed Date Initiated: 04/18/2022 Target Resolution Date: 05/10/2022 Goal Status: Active Patient/caregiver will verbalize understanding of reason and process for debridement of necrotic tissue Date Initiated: 04/18/2022 Target Resolution Date: 05/10/2022 Goal Status: Active Interventions: Assess patient pain level pre-, during and post procedure and prior to discharge Provide education on necrotic tissue and debridement process Treatment Activities: Apply topical anesthetic as ordered : 04/18/2022 T ordered outside of clinic : 04/18/2022 est Notes: Pain, Acute or Chronic Nursing Diagnoses: Pain, acute or chronic: actual or potential Potential alteration in comfort, pain Goals: Patient will verbalize adequate pain control and receive pain control interventions during procedures as needed Date Initiated: 04/18/2022 Target Resolution Date: 05/10/2022 Goal Status: Active Patient/caregiver will verbalize comfort level met Date Initiated: 04/18/2022 Target Resolution Date: 05/10/2022 Goal Status: Active Interventions: Encourage patient to take pain medications as prescribed Provide education on pain management Reposition patient for comfort Treatment Activities: Administer pain control measures as ordered :  04/18/2022 Notes: Wound/Skin Impairment Nursing Diagnoses: Knowledge deficit related to ulceration/compromised skin integrity Goals: Patient/caregiver will verbalize understanding of skin care regimen Date Initiated: 04/18/2022 Target Resolution Date: 05/10/2022 Goal Status: Active Interventions: Assess patient/caregiver ability to perform ulcer/skin care regimen upon admission and as needed Assess ulceration(s) every visit Provide education on ulcer and skin care Treatment Activities: Skin care regimen initiated : 04/18/2022 Topical wound management initiated : 04/18/2022 Notes: Electronic Signature(s) Signed: 05/02/2022 4:19:15 PM By: Rhae Hammock RN Entered By: Rhae Hammock on 05/02/2022 15:26:03 -------------------------------------------------------------------------------- Pain Assessment Details Patient Name: Date of Service: NIKOLA, BLACKSTON [place] 05/02/2022 3:15 PM Medical Record Number: [id number] Patient Account Number: [id number] Date of Birth/Sex: Treating RN: 04/08/1941 (81 y.o. Tonita Phoenix, Lauren Primary Care Asaad Gulley: MA Ok Edwards MES Other Clinician: Referring Floy Angert: Treating Armina Galloway/Extender: Roswell Nickel, JA MES Weeks in Treatment: 2 Active Problems Location of Pain Severity and Description of Pain Patient Has Paino  No Site Locations Pain Management and Medication Current Pain Management: Electronic Signature(s) Signed: 05/02/2022 4:19:15 PM By: Rhae Hammock RN Entered By: Rhae Hammock on 05/02/2022 15:17:27 -------------------------------------------------------------------------------- Patient/Caregiver Education Details Patient Name: Date of Service: MARTINIQUE, PIZZIMENTI RIA 9/28/2023andnbsp3:15 PM Medical Record Number: [id number] Patient Account Number: [id number] Date of Birth/Gender: Treating RN: September 08, 1940 (81 y.o. Tonita Phoenix, Lauren Primary Care Physician: MA Ok Edwards MES Other Clinician: Referring Physician: Treating  Physician/Extender: Roswell Nickel, JA MES Weeks in Treatment: 2 Education Assessment Education Provided To: Patient Education Topics Provided Pain: Methods: Explain/Verbal Responses: State content correctly Electronic Signature(s) Signed: 05/02/2022 4:19:15 PM By: Rhae Hammock RN Entered By: Rhae Hammock on 05/02/2022 15:26:22 -------------------------------------------------------------------------------- Wound Assessment Details Patient Name: Date of Service: MALAJAH, OCEGUERA [place] 05/02/2022 3:15 PM Medical Record Number: [id number] Patient Account Number: [id number] Date of Birth/Sex: Treating RN: 09/03/1940 (81 y.o. Donalda Ewings Primary Care Preet Perrier: MA Ok Edwards MES Other Clinician: Referring Vishnu Moeller: Treating Tene Gato/Extender: Roswell Nickel, JA MES Weeks in Treatment: 2 Wound Status Wound Number: 1 Primary Etiology: Open Surgical Wound Wound Location: Right, Lateral Ankle Secondary Etiology: Dehisced Wound Wounding Event: Surgical Injury Wound Status: Open Date Acquired: 02/01/2022 Weeks Of Treatment: 2 Clustered Wound: Yes Photos Wound Measurements Length: (cm) 7. Width: (cm) 0. Depth: (cm) 0. Clustered Quantity: 3 Area: (cm) 2 Volume: (cm) 1 6 % Reduction in Area: -50.8% 5 % Reduction in Volume: -50.8% 4 Epithelialization: Medium (34-66%) Tunneling: No .985 Undermining: No .194 Wound Description Classification: Full Thickness With Exposed Support Structures Wound Margin: Distinct, outline attached Exudate Amount: Medium Exudate Type: Serosanguineous Exudate Color: red, brown Foul Odor After Cleansing: No Slough/Fibrino Yes Wound Bed Granulation Amount: Medium (34-66%) Exposed Structure Granulation Quality: Red, Pink Fascia Exposed: No Necrotic Amount: Medium (34-66%) Fat Layer (Subcutaneous Tissue) Exposed: Yes Necrotic Quality: Adherent Slough Tendon Exposed: No Muscle Exposed: No Joint Exposed: No Bone  Exposed: No Assessment Notes Plate visible Electronic Signature(s) Signed: 05/07/2022 5:11:51 PM By: Sharyn Creamer RN, BSN Entered By: Sharyn Creamer on 05/02/2022 15:19:39 -------------------------------------------------------------------------------- [place] Details Patient Name: Date of Service: CHRYSTINE, FROGGE [place] 05/02/2022 3:15 PM Medical Record Number: [id number] Patient Account Number: [id number] Date of Birth/Sex: Treating RN: 12-10-1940 (81 y.o. Tonita Phoenix, Lauren Primary Care Quamir Willemsen: MA Ok Edwards MES Other Clinician: Referring Karon Heckendorn: Treating Eisley Barber/Extender: Roswell Nickel, JA MES Weeks in Treatment: 2 Vital Signs Time Taken: 15:16 Temperature (F): 99 Height (in): 64 Pulse (bpm): 81 Weight (lbs): 115 Respiratory Rate (breaths/min): 17 Body Mass Index (BMI): 19.7 Blood Pressure (mmHg): 147/73 Reference Range: 80 - 120 mg / dl Electronic Signature(s) Signed: 05/02/2022 4:19:15 PM By: Rhae Hammock RN Entered By: Rhae Hammock on 05/02/2022 15:17:14

## 2022-05-09 NOTE — Progress Notes (Signed)
Gina Cline ([id number]) , Visit Report for 05/07/2022 Chief Complaint Document Details Patient Name: Date of Service: Gina Cline, Gina Cline 05/07/2022 3:15 PM Medical Record Number: [id number] Patient Account Number: [id number] Date of Birth/Sex: Treating RN: Mar 22, 1941 (81 y.o. Tonita Phoenix, Lauren Primary Care Provider: MA Ok Edwards MES Other Clinician: Referring Provider: Treating Provider/Extender: Horton Marshall, JA MES Weeks in Treatment: 2 Information Obtained from: Patient Chief Complaint 04/18/22; patient is here for review of 2 small wounds on the right lateral lower leg Electronic Signature(s) Signed: 05/07/2022 4:41:45 PM By: Kalman Shan DO Entered By: Kalman Shan on 05/07/2022 16:29:40 -------------------------------------------------------------------------------- HPI Details Patient Name: Date of Service: Gina Cline [place] 05/07/2022 3:15 PM Medical Record Number: [id number] Patient Account Number: [id number] Date of Birth/Sex: Treating RN: Dec 16, 1940 (81 y.o. Tonita Phoenix, Lauren Primary Care Provider: MA Ok Edwards MES Other Clinician: Referring Provider: Treating Provider/Extender: Horton Marshall, JA MES Weeks in Treatment: 2 History of Present Illness HPI Description: Admission 04/18/2022 This is a 81 year old healthy woman who had a fall on 02/01/2022 she underwent ORIF of the right ankle bimalleolar fracture surgery by Dr. Posey Pronto of podiatry. The last time that she saw him she says that he use Betadine wet-to-dry. She spent some time at the beach but did not get her leg in water was seen on 04/09/2022 in urgent care with significant erythema of the right ankle lateral aspect. She was put on doxycycline at that point and the erythema seems to have improved. She is not complaining of a lot of pain but she does have drainage from the superior wound. She has not been systemically unwell. 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 She is not a diabeticOr a smoker. ABI in our clinic was 1.18 on the right 9/21; patient presents for follow-up. She was admitted at last clinic visit by Dr. Dellia Nims. She had a wound culture done at last clinic visit that grew Pseudomonas Sensitive to ciprofloxacin. Dr. Dellia Nims had referred her back to Dr. Posey Pronto for Surgical reevaluation since hardware was exposed. She saw Dr. Posey Pronto on 9/20. He states that the wounds are superficial. Currently she has been placing collagen to the wound beds. 9/28; patient admitted the clinic 2 weeks ago. Culture of the superior open area grew Pseudomonas. She had been treated previously with doxycycline she has completed week of ciprofloxacin. She is using topical gentamicin She saw Dr. Posey Pronto and per the family he was not interested in removing the plate. He did recognize that the wounds probe to hardware however in 2 locations. The patient is systemically well. She had scant amount of drainage coming out of each one of the 3 open areas on the lateral right ankle. She has a CT scan of the ankle tomorrow 10/3; patient presents for follow-up. She had a CT scan that showed no evidence of osteomyelitis to the right ankle. She has been using gentamicin ointment to the wound bed. She states she has an appointment with orthopedic surgery for second opinion on 10/11. She currently denies signs of infection. Electronic Signature(s) Signed: 05/07/2022 4:41:45 PM By: Kalman Shan DO Entered By: Kalman Shan on 05/07/2022 16:38:21 -------------------------------------------------------------------------------- Physical Exam Details Patient Name: Date of Service: Gina Cline [place] 05/07/2022 3:15 PM Medical Record Number: [id number] Patient Account Number: [id number] Date of Birth/Sex: Treating RN: [birth year] (81 y.o. Benjaman Lobe Primary  Care  Provider: Ainsley Spinner MES Other Clinician: Referring Provider: Treating Provider/Extender: Kalman Shan MA NNING, JA MES Weeks in Treatment: 2 Constitutional respirations regular, non-labored and within target range for patient.. Cardiovascular 2+ dorsalis pedis/posterior tibialis pulses. Psychiatric pleasant and cooperative. Notes Wound exam; right lower extremity surgical incision on the right lateral ankle. There are 3 open areas superior and inferior ones clearly probe to hardware. The middle 1 does not. There is no purulent drainage and the erythema. There is no tenderness and no increased warmth Electronic Signature(s) Signed: 05/07/2022 4:41:45 PM By: Kalman Shan DO Entered By: Kalman Shan on 05/07/2022 16:38:57 -------------------------------------------------------------------------------- Physician Orders Details Patient Name: Date of Service: Gina Cline [place] 05/07/2022 3:15 PM Medical Record Number: [id number] Patient Account Number: [id number] Date of Birth/Sex: Treating RN: 01/26/1941 (81 y.o. Tonita Phoenix, Lauren Primary Care Provider: MA Ok Edwards MES Other Clinician: Referring Provider: Treating Provider/Extender: Kalman Shan MA NNING, JA MES Weeks in Treatment: 2 Verbal / Phone Orders: No Diagnosis Coding Follow-up Appointments ppointment in 2 weeks. - Dr. Heber [place] and Tammi Klippel, Room 8 Return A Other: - [employer] Vashe use to clean wounds. CT scan 05/03/22 Anesthetic (In clinic) Topical Lidocaine 4% applied to wound bed Bathing/ Shower/ Hygiene May shower with protection but do not get wound dressing(s) wet. Edema Control - Lymphedema / SCD / Other Avoid standing for long periods of time. Wound Treatment Wound #1 - Ankle Wound Laterality: Right, Lateral Cleanser: VASHE 1 x Per Day/30 Days Discharge Instructions: ****VASHE to purchase off [place].*** Peri-Wound Care: Skin Prep (DME) (Generic) 1 x Per Day/30 Days Discharge Instructions: Use  skin prep as directed Topical: Gentamicin 1 x Per Day/30 Days Discharge Instructions: apply directly to wound bed. Secondary Dressing: Zetuvit Plus Silicone Border Dressing 7x7(in/in) (DME) (Generic) 1 x Per Day/30 Days Discharge Instructions: Apply silicone border over primary dressing as directed. Electronic Signature(s) Signed: 05/07/2022 4:41:45 PM By: Kalman Shan DO Previous Signature: 05/07/2022 4:30:19 PM Version By: Rhae Hammock RN Entered By: Kalman Shan on 05/07/2022 16:39:06 -------------------------------------------------------------------------------- Problem List Details Patient Name: Date of Service: ENDIYA, KLAHR [place] 05/07/2022 3:15 PM Medical Record Number: [id number] Patient Account Number: [id number] Date of Birth/Sex: Treating RN: 1941-03-15 (81 y.o. 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 Tonita Phoenix, Lauren Primary  Care Provider: MA Ok Edwards MES Other Clinician: Referring Provider: Treating Provider/Extender: Horton Marshall, JA MES Weeks in Treatment: 2 Subjective Chief Complaint Information obtained from Patient 04/18/22; patient is here for review of 2 small wounds on the right lateral lower leg History of Present Illness (HPI) Admission 04/18/2022 This is a 81 year old healthy woman who had a fall on 02/01/2022 she underwent ORIF of the right ankle bimalleolar fracture surgery by Dr. Posey Pronto of podiatry. The last  time that she saw him she says that he use Betadine wet-to-dry. She spent some time at the beach but did not get her leg in water was seen on 04/09/2022 in urgent care with significant erythema of the right ankle lateral aspect. She was put on doxycycline at that point and the erythema seems to have improved. She is not complaining of a lot of pain but she does have drainage from the superior wound. She has not been systemically unwell. 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 She is not a diabeticOr a smoker. ABI in our clinic was 1.18 on the right 9/21; patient presents for follow-up. She was admitted at last clinic visit by Dr. Dellia Nims. She had a wound culture done at last clinic visit that grew Pseudomonas Sensitive to ciprofloxacin. Dr. Dellia Nims had referred her back to Dr. Posey Pronto for Surgical reevaluation since hardware was exposed. She saw Dr. Posey Pronto on 9/20. He states that the wounds are superficial. Currently she has been placing collagen to the wound beds. 9/28; patient admitted the clinic 2 weeks ago. Culture of the superior open area grew Pseudomonas. She had been treated previously with doxycycline she has completed week of ciprofloxacin. She is using topical gentamicin She saw Dr. Posey Pronto and per the family he was not interested in  removing the plate. He did recognize that the wounds probe to hardware however in 2 locations. The patient is systemically well. She had scant amount of drainage coming out of each one of the 3 open areas on the lateral right ankle. She has a CT scan of the ankle tomorrow 10/3; patient presents for follow-up. She had a CT scan that showed no evidence of osteomyelitis to the right ankle. She has been using gentamicin ointment to the wound bed. She states she has an appointment with orthopedic surgery for second opinion on 10/11. She currently denies signs of infection. Patient History Information obtained from Patient, Chart. Family History Diabetes - Father, Heart Disease - Father. Social History Never smoker, Marital Status - Married, Alcohol Use - Never, Drug Use - No History, Caffeine Use - Never. Medical History Hospitalization/Surgery History - 02/01/2022 ORIF right ankle Dr. Posey Pronto. - Mohs surgery. Medical A Surgical History Notes nd Constitutional Symptoms (General Health) cellulitis to right ankle 04/09/2022 urgent care and 04/13/2022 ED evaluation as well until wound care appt. hypothyroidism Gastrointestinal GERD Oncologic Basal Cell Skin Ca Objective Constitutional respirations regular, non-labored and within target range for patient.. Vitals Time Taken: 3:08 PM, Height: 64 in, Weight: 115 lbs, BMI: 19.7, Temperature: 98.5 F, Pulse: 76 bpm, Respiratory Rate: 18 breaths/min, Blood Pressure: 154/79 mmHg. Cardiovascular 2+ dorsalis pedis/posterior tibialis pulses. Psychiatric pleasant and cooperative. General Notes: Wound exam; right lower extremity surgical incision on the right lateral ankle. There are 3 open areas superior and inferior ones clearly probe to hardware. The middle 1 does not. There is no purulent drainage and the erythema. There is no tenderness and no increased warmth Integumentary (Hair, Skin) Wound #1 status is Open. Original cause of wound was Surgical  Injury. The date acquired was: 02/01/2022. The wound has been in treatment 2 weeks. The wound is located on the Right,Lateral Ankle. The wound measures 7.4cm length x 0.4cm width x 0.2cm depth; 2.325cm^2 area and 0.465cm^3 volume. There is Fat Layer (Subcutaneous Tissue) exposed. There is no tunneling or undermining noted. There is  a medium amount of serosanguineous drainage noted. The wound margin is distinct with the outline attached to the wound base. There is medium (34-66%) red, pink granulation within the wound bed. There is a medium (34-66%) amount of necrotic tissue within the wound bed including Adherent Slough. The periwound skin appearance had no abnormalities noted for texture. The periwound skin appearance had no abnormalities noted for moisture. The periwound skin appearance exhibited: Erythema. The periwound skin appearance did not exhibit: Atrophie Blanche, Cyanosis, Ecchymosis, Hemosiderin Staining, Mottled, Pallor, Rubor. The surrounding wound skin color is noted with erythema which is circumferential. Erythema is measured at 0.5 cm. Periwound temperature was noted as No Abnormality. 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 They clearly probe to hardware. She has a second opinion with orthopedic surgery on 10/11. Currently there is no evidence of soft tissue infection. I recommended continuing gentamicin ointment. If hardware removal is not an option I will refer her to infectious disease. Plan Follow-up Appointments: Return Appointment in 2 weeks. - Dr. Heber [place] and Tammi Klippel, Room 8 Other: - [employer] Vashe use to clean wounds. CT scan 05/03/22 Anesthetic: (In clinic) Topical Lidocaine 4% applied  to wound bed Bathing/ Shower/ Hygiene: May shower with protection but do not get wound dressing(s) wet. Edema Control - Lymphedema / SCD / Other: Avoid standing for long periods of time. WOUND #1: - Ankle Wound Laterality: Right, Lateral Cleanser: VASHE 1 x Per Day/30 Days Discharge Instructions: ****VASHE to purchase off [place].*** Peri-Wound Care: Skin Prep (DME) (Generic) 1 x Per Day/30 Days Discharge Instructions: Use skin prep as directed Topical: Gentamicin 1 x Per Day/30 Days Discharge Instructions: apply directly to wound bed. Secondary Dressing: Zetuvit Plus Silicone Border Dressing 7x7(in/in) (DME) (Generic) 1 x Per Day/30 Days Discharge Instructions: Apply silicone border over primary dressing as directed. 1. Continue gentamicin ointment 2. Follow-up in 2 weeks Electronic Signature(s) Signed: 05/07/2022 4:41:45 PM By: Kalman Shan DO Entered By: Kalman Shan on 05/07/2022 16:40:09 -------------------------------------------------------------------------------- HxROS Details Patient Name: Date of Service: LILLIAN, BALLESTER [place] 05/07/2022 3:15 PM Medical Record Number: [id number] Patient Account Number: [id number] Date of Birth/Sex: Treating RN: 11/26/1940 (81 y.o. Tonita Phoenix, Lauren Primary Care Provider: MA Ok Edwards MES Other Clinician: Referring Provider: Treating Provider/Extender: Horton Marshall, JA MES Weeks in Treatment: 2 Information Obtained From Patient Chart Constitutional Symptoms (General Health) Medical History: Past Medical History Notes: cellulitis to right ankle 04/09/2022 urgent care and 04/13/2022 ED evaluation as well until wound care appt. hypothyroidism Gastrointestinal Medical History: Past Medical History Notes: GERD Oncologic Medical History: Past Medical History Notes: Basal Cell Skin Ca Immunizations Pneumococcal Vaccine: Received Pneumococcal Vaccination: No Implantable Devices No devices added Hospitalization / Surgery  History Type of Hospitalization/Surgery 02/01/2022 ORIF right ankle Dr. Posey Pronto Mohs surgery Family and Social History Diabetes: Yes - Father; Heart Disease: Yes - Father; Never smoker; Marital Status - Married; Alcohol Use: Never; Drug Use: No History; Caffeine Use: Never; Financial Concerns: No; Food, Clothing or Shelter Needs: No; Support System Lacking: No; Transportation Concerns: No Electronic Signature(s) Signed: 05/07/2022 4:41:45 PM By: Kalman Shan DO Signed: 05/09/2022 3:46:02 PM By: Rhae Hammock RN Previous Signature: 05/07/2022 4:30:19 PM Version By: Rhae Hammock RN Entered By: Kalman Shan on 05/07/2022 16:38:28 -------------------------------------------------------------------------------- [place] Details Patient Name: Date of Service: Tobie Lords, GLO [place] 05/07/2022  Medical Record Number: [id number] Patient Account Number: [id number] Date of Birth/Sex: Treating RN: 03/07/1941 (81 y.o. 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 Z96.698 Presence of other orthopedic joint implants Facility Procedures CPT4 Code: [id number] Description: 99213 - WOUND CARE VISIT-LEV 3 EST PT Modifier: Quantity: 1 Physician Procedures : CPT4 Code Description Modifier [id number] - WC PHYS LEVEL 3 - EST PT ICD-10 Diagnosis Description T81.32XD Disruption of internal operation (surgical) wound, not elsewhere classified, subsequent encounter L03.115 Cellulitis of right lower limb  L97.918 Non-pressure chronic ulcer of unspecified part of right lower  leg with other specified severity Z96.698 Presence of other orthopedic joint implants Quantity: 1 Electronic Signature(s) Signed: 05/07/2022 4:41:45 PM By: Kalman Shan DO Previous Signature: 05/07/2022 4:30:19 PM Version By: Rhae Hammock RN Entered By: Kalman Shan on 05/07/2022 16:40:27

## 2022-05-21 ENCOUNTER — Ambulatory Visit (HOSPITAL_BASED_OUTPATIENT_CLINIC_OR_DEPARTMENT_OTHER): Payer: Medicare HMO | Admitting: Internal Medicine

## 2022-06-05 ENCOUNTER — Encounter: Payer: Medicare HMO | Admitting: Podiatry

## 2022-09-30 ENCOUNTER — Other Ambulatory Visit (HOSPITAL_COMMUNITY): Payer: Self-pay

## 2023-05-14 ENCOUNTER — Other Ambulatory Visit: Payer: Self-pay

## 2023-07-28 ENCOUNTER — Ambulatory Visit
Admission: EM | Admit: 2023-07-28 | Discharge: 2023-07-28 | Disposition: A | Discharge status: Home / Self Care | Payer: HMO | Attending: Family Medicine | Admitting: Family Medicine

## 2023-07-28 DIAGNOSIS — U071 COVID-19: Secondary | ICD-10-CM | POA: Diagnosis not present

## 2023-07-28 DIAGNOSIS — R059 Cough, unspecified: Secondary | ICD-10-CM | POA: Diagnosis not present

## 2023-07-28 DIAGNOSIS — R509 Fever, unspecified: Secondary | ICD-10-CM | POA: Diagnosis not present

## 2023-07-28 LAB — POC SARS CORONAVIRUS 2 AG -  ED: SARS Coronavirus 2 Ag: POSITIVE — AB

## 2023-07-28 MED ORDER — HYDROCODONE BIT-HOMATROP MBR 5-1.5 MG/5ML PO SOLN: 5.0000 mL | Freq: Four times a day (QID) | ORAL | 0 refills | Status: AC | PRN

## 2023-07-28 MED ORDER — BENZONATATE 200 MG PO CAPS: 200.0000 mg | ORAL_CAPSULE | Freq: Three times a day (TID) | ORAL | 0 refills | Status: AC | PRN

## 2023-07-28 MED ORDER — PAXLOVID (300/100) 20 X 150 MG & 10 X 100MG PO TBPK: 3.0000 | ORAL_TABLET | Freq: Two times a day (BID) | ORAL | 0 refills | Status: AC

## 2023-07-28 NOTE — ED Provider Notes (Signed)
[facility]    CSN: [id number] Arrival date & time: 07/28/23  1052      History   Chief Complaint Chief Complaint  Patient presents with   Cough   Covid Exposure    HPI Gina Cline is a 82 y.o. female.   HPI Pleasant 82 year old female presents with cough and congestion since yesterday.  Reports oral temperature of 100.2 yesterday.  PMH significant for BSC, hypothyroidism, and GERD.  Is accompanied by her husband who we will also be evaluated for cough today.  Past Medical History:  Diagnosis Date   Cancer (HCC)    basal cell skin cancer   GERD (gastroesophageal reflux disease)    Hypothyroidism    h/o no meds   Palpitations    very rarely when she lies down at night    There are no active problems to display for this patient.   Past Surgical History:  Procedure Laterality Date   COLONOSCOPY     MOHS SURGERY     ORIF ANKLE FRACTURE Right 02/01/2022   Procedure: OPEN REDUCTION INTERNAL FIXATION (ORIF) RIGHT ANKLE FRACTURE;  Surgeon: Candelaria Stagers, DPM;  Location: [facility] ORS;  Service: Orthopedics;  Laterality: Right;  CHOICE WITH BLOCK    OB History   No obstetric history on file.      Home Medications    Prior to Admission medications   Medication Sig Start Date End Date Taking? Authorizing Provider  benzonatate (TESSALON) 200 MG capsule Take 1 capsule (200 mg total) by mouth 3 (three) times daily as needed for up to 7 days. 07/28/23 08/04/23 Yes Trevor Iha, FNP  HYDROcodone bit-homatropine (HYCODAN) 5-1.5 MG/5ML syrup Take 5 mLs by mouth every 6 (six) hours as needed for cough. 07/28/23  Yes Trevor Iha, FNP  nirmatrelvir/ritonavir (PAXLOVID, 300/100,) 20 x 150 MG & 10 x 100MG  TBPK Take 3 tablets by mouth 2 (two) times daily for 5 days. Patient GFR is >80. Take nirmatrelvir (150 mg) two tablets twice daily for 5 days and ritonavir (100 mg) one tablet twice daily for 5 days. 07/28/23 08/02/23 Yes Trevor Iha, FNP  Calcium  Carb-Cholecalciferol (CALCIUM 500+D3 PO) Take 1 tablet by mouth daily at 6 (six) AM.    [provider]  Cholecalciferol (VITAMIN D-1000 MAX ST) 25 MCG (1000 UT) tablet Take 1,000 Units by mouth daily.    [provider]  Ferrous Sulfate (IRON PO) Take 1 tablet by mouth 3 (three) times a week.    [provider]  ibuprofen (ADVIL) 800 MG tablet Take 1 tablet (800 mg total) by mouth every 6 (six) hours as needed. 02/15/22   Candelaria Stagers, DPM  omeprazole (PRILOSEC) 40 MG capsule Take 40 mg by mouth every morning.    [provider]    Family History History reviewed. No pertinent family history.  Social History Social History   Tobacco Use   Smoking status: Never   Smokeless tobacco: Never  Vaping Use   Vaping status: Never Used  Substance Use Topics   Alcohol use: Not Currently     Allergies   Penicillins and Erythromycin   Review of Systems Review of Systems  Constitutional:  Positive for fever.  HENT:  Positive for congestion.   Respiratory:  Positive for cough.   All other systems reviewed and are negative.    Physical Exam Triage Vital Signs ED Triage Vitals [07/28/23 1101]  Encounter Vitals Group     BP      Systolic BP Percentile  Diastolic BP Percentile      Pulse      Resp      Temp      Temp src      SpO2      Weight      Height      Head Circumference      Peak Flow      Pain Score 0     Pain Loc      Pain Education      Exclude from Growth Chart    No data found.  Updated Vital Signs BP 127/70 (BP Location: Left Arm)   Pulse (!) 101   Temp 98.5 F (36.9 C) (Oral)   Resp 17   SpO2 96%     Physical Exam Vitals and nursing note reviewed.  Constitutional:      Appearance: Normal appearance. She is normal weight. She is ill-appearing.  HENT:     Head: Normocephalic and atraumatic.     Right Ear: Tympanic membrane, ear canal and external ear normal.     Left Ear: Tympanic membrane, ear canal and  external ear normal.     Nose: Nose normal.     Mouth/Throat:     Mouth: Mucous membranes are moist.     Pharynx: Oropharynx is clear.  Eyes:     Extraocular Movements: Extraocular movements intact.     Conjunctiva/sclera: Conjunctivae normal.     Pupils: Pupils are equal, round, and reactive to light.  Cardiovascular:     Rate and Rhythm: Normal rate and regular rhythm.     Pulses: Normal pulses.     Heart sounds: Normal heart sounds.  Pulmonary:     Effort: Pulmonary effort is normal.     Breath sounds: Normal breath sounds. No wheezing, rhonchi or rales.     Comments: Infrequent nonproductive cough noted on exam Musculoskeletal:        General: Normal range of motion.     Cervical back: Normal range of motion and neck supple.  Skin:    General: Skin is warm and dry.  Neurological:     General: No focal deficit present.     Mental Status: She is alert and oriented to person, place, and time. Mental status is at baseline.  Psychiatric:        Mood and Affect: Mood normal.        Behavior: Behavior normal.      UC Treatments / Results  Labs (all labs ordered are listed, but only abnormal results are displayed) Labs Reviewed  POC SARS CORONAVIRUS 2 AG -  ED - Abnormal; Notable for the following components:      Result Value   SARS Coronavirus 2 Ag Positive (*)    All other components within normal limits    EKG   Radiology No results found.  Procedures Procedures (including critical care time)  Medications Ordered in UC Medications - No data to display  Initial Impression / Assessment and Plan / UC Course  I have reviewed the triage vital signs and the nursing notes.  Pertinent labs & imaging results that were available during my care of the patient were reviewed by me and considered in my medical decision making (see chart for details).     MDM: 1.  COVID-19-Rx'd Paxlovid: Take 3 tablets twice daily x 5 days; 2.  Cough, unspecified type-Rx'd Tessalon 200  mg capsules: Take 1 capsule 3 times daily, as needed for cough, Rx'd Hycodan 5-1.5 mg / 5  mL syrup: Take 5 mL every 6 hours for cough. Advised patient to take medications as directed with food to completion.  Advised may take Tessalon Perles daily or as needed for cough.  Advised may use Hycodan at night prior to sleep for cough due to sedative effects. 3.  Fever, unspecified-advised patient may take 1 g of Tylenol every 6 hours for fever (oral temperature greater than 100.3).  Encouraged to increase daily water intake to 64 ounces per day while taking these medications.  Patient discharged home, hemodynamically stable. Final Clinical Impressions(s) / UC Diagnoses   Final diagnoses:  Cough, unspecified type  Fever, unspecified  COVID-19     Discharge Instructions      Advised patient to take medications as directed with food to completion.  Advised may take Tessalon Perles daily or as needed for cough.  Advised may use Hycodan at night prior to sleep for cough due to sedative effects.  Advised patient may take 1 g of Tylenol every 6 hours for fever (oral temperature greater than 100.3).  Encouraged to increase daily water intake to 64 ounces per day while taking these medications.     ED Prescriptions     Medication Sig Dispense Auth. Provider   nirmatrelvir/ritonavir (PAXLOVID, 300/100,) 20 x 150 MG & 10 x 100MG  TBPK Take 3 tablets by mouth 2 (two) times daily for 5 days. Patient GFR is >80. Take nirmatrelvir (150 mg) two tablets twice daily for 5 days and ritonavir (100 mg) one tablet twice daily for 5 days. 30 tablet Trevor Iha, FNP   benzonatate (TESSALON) 200 MG capsule Take 1 capsule (200 mg total) by mouth 3 (three) times daily as needed for up to 7 days. 40 capsule Trevor Iha, FNP   HYDROcodone bit-homatropine (HYCODAN) 5-1.5 MG/5ML syrup Take 5 mLs by mouth every 6 (six) hours as needed for cough. 120 mL Trevor Iha, FNP      PDMP not reviewed this encounter.    Trevor Iha, FNP 07/28/23 1206

## 2023-07-28 NOTE — ED Triage Notes (Signed)
Pt c/o cough and congestion since yesterday. Temp 100.2 yesterday. States she was exposed to covid last thurs. OTC Alkaseltzer prn.

## 2023-07-28 NOTE — Discharge Instructions (Addendum)
Advised patient to take medications as directed with food to completion.  Advised may take Tessalon Perles daily or as needed for cough.  Advised may use Hycodan at night prior to sleep for cough due to sedative effects.  Advised patient may take 1 g of Tylenol every 6 hours for fever (oral temperature greater than 100.3).  Encouraged to increase daily water intake to 64 ounces per day while taking these medications.

## 2023-09-14 IMAGING — DX DG ANKLE COMPLETE 3+V*R*
3 series · 3 of 3 positions shown · non-contrast
Comparison: None Available.

CLINICAL DATA: Right ankle pain after slipped and garden this
morning. Injury. Lateral pain and swelling.

EXAM:
RIGHT ANKLE - COMPLETE 3+ VIEW

[ankle ap]
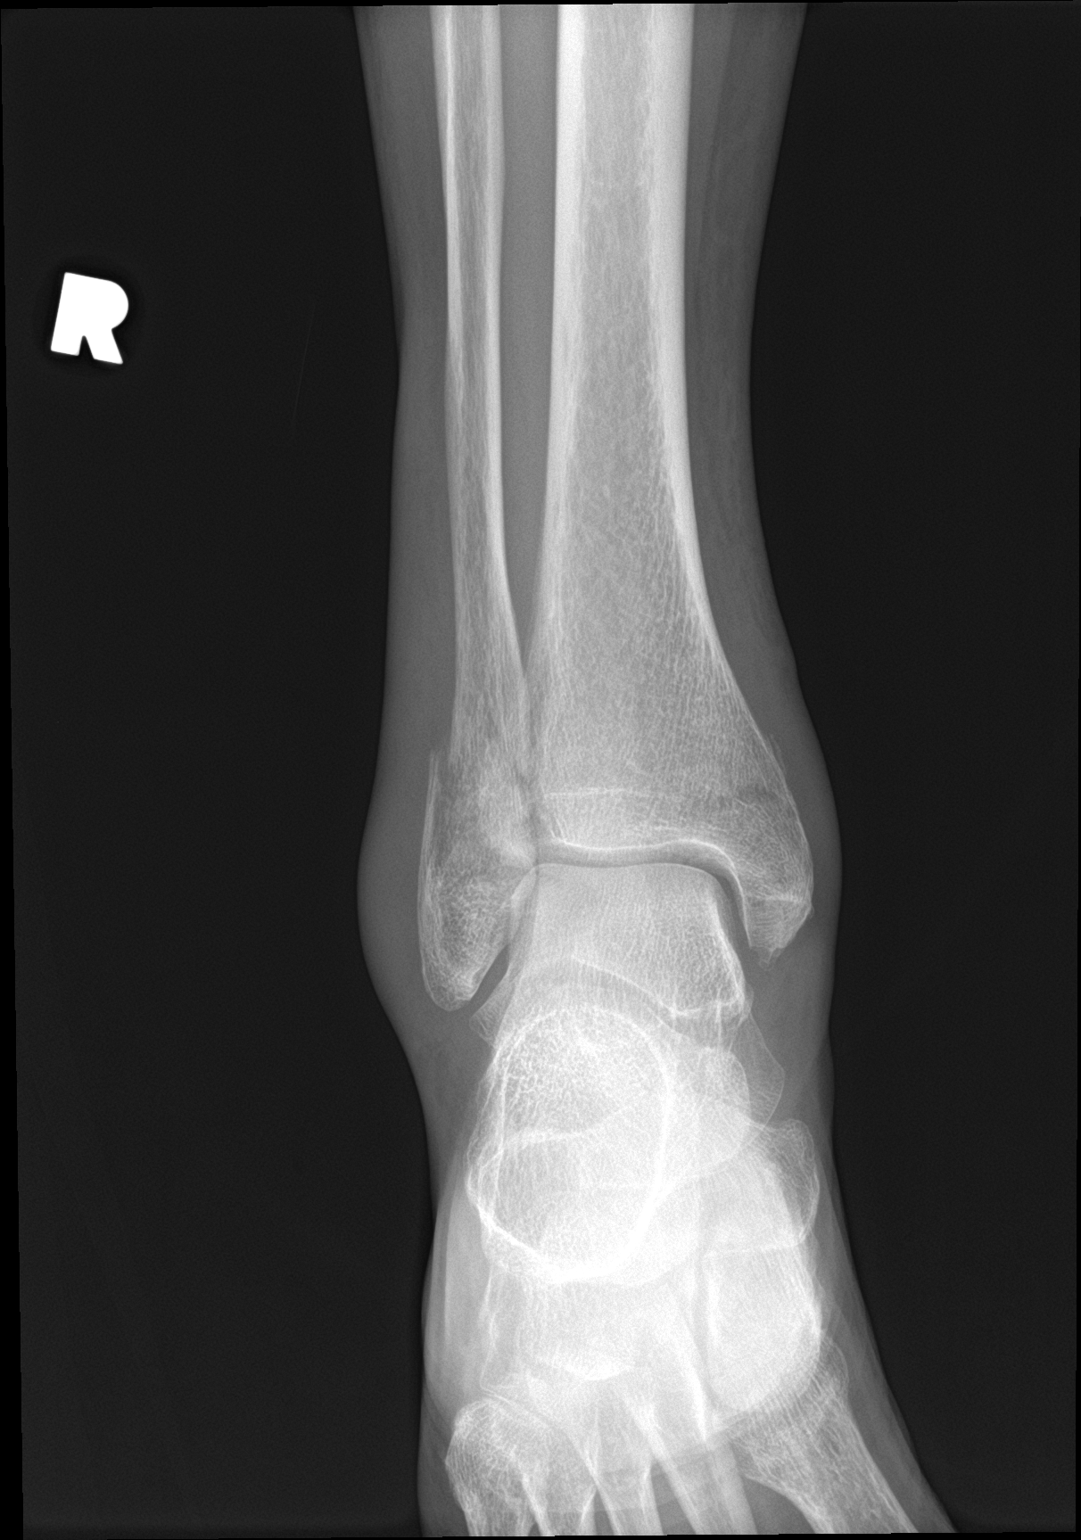

[ankle obl]
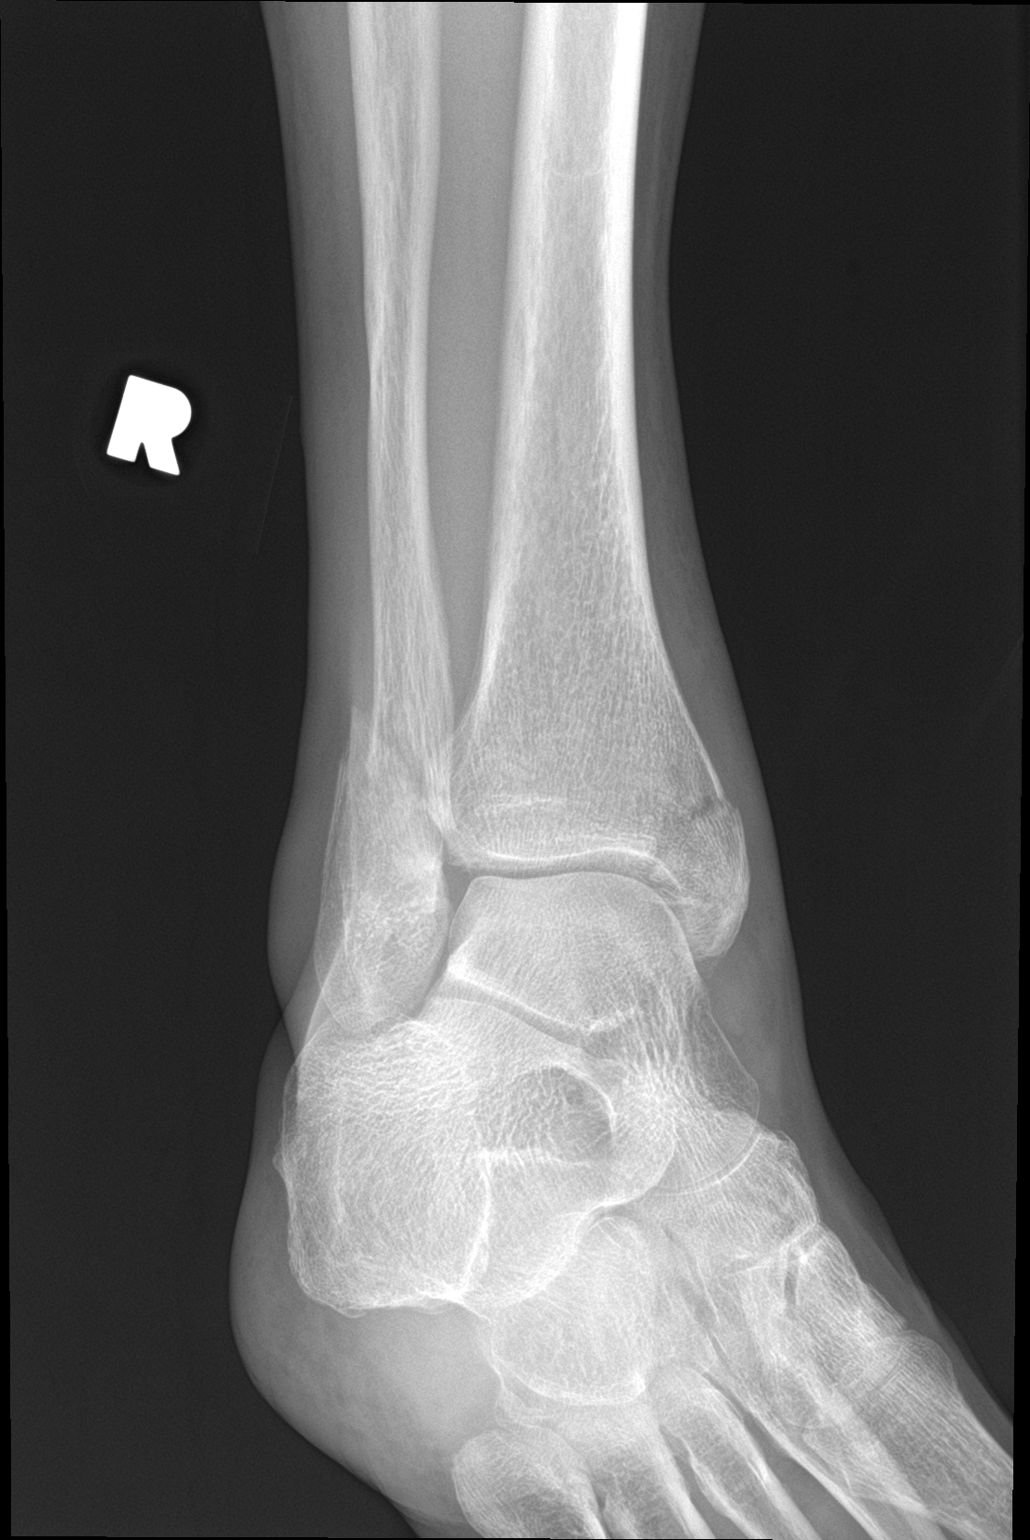

[ankle lat]
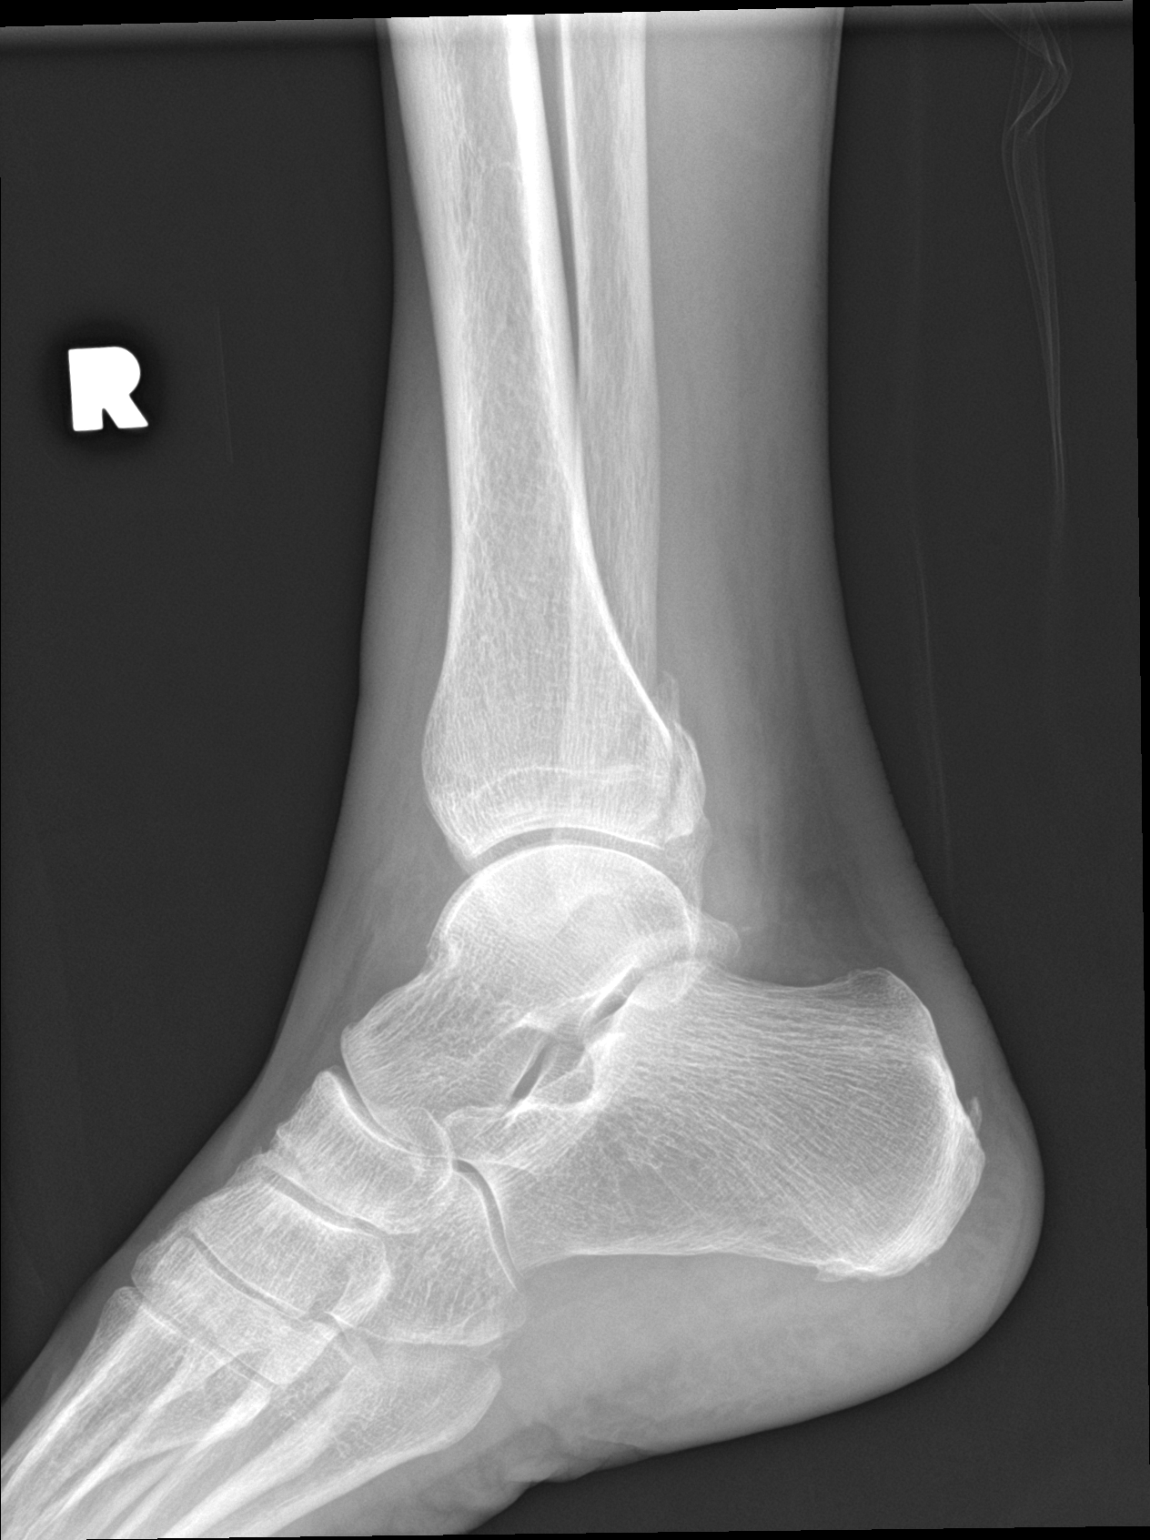

[3 of 3 positions shown; findings below may reference images not displayed]

FINDINGS: Moderate lateral and mild medial malleolar soft tissue swelling.
There is an oblique fracture of the distal fibular metadiaphysis
with up to 3 mm lateral cortical displacement/step-off of the distal
component with respect to the proximal component. There is up to 3
mm posterior displacement of the distal fracture component with
respect to the proximal fracture component on lateral view.

Additional oblique fracture of the superior medial malleolus without
displacement.

Vertically oriented fracture of the posterior malleolus with
approximately 2 mm superior displacement of the posterior component
with respect to the anterior component. The ankle mortise remains
symmetric and intact.

Minimal plantar and posterior calcaneal heel spurs.  No dislocation.
IMPRESSION: Mildly displaced distal fibular acute oblique fracture, mildly
displaced acute posterior malleolar fracture, and nondisplaced
superior medial malleolar acute fracture.

## 2023-12-18 ENCOUNTER — Other Ambulatory Visit (HOSPITAL_COMMUNITY): Payer: Self-pay
